# Patient Record
Sex: Male | Born: 1985 | Race: White | Hispanic: No | Marital: Married | Smoking: Current every day smoker
Health system: Southern US, Community
[De-identification: ages and names within clinical notes are randomized; demographics above are authoritative.]

## PROBLEM LIST (undated history)

## (undated) DIAGNOSIS — K219 Gastro-esophageal reflux disease without esophagitis: Secondary | ICD-10-CM

## (undated) DIAGNOSIS — N4400 Torsion of testis, unspecified: Secondary | ICD-10-CM

## (undated) DIAGNOSIS — A4902 Methicillin resistant Staphylococcus aureus infection, unspecified site: Secondary | ICD-10-CM

## (undated) HISTORY — PX: HERNIA REPAIR: SHX51

## (undated) HISTORY — PX: TESTICLE SURGERY: SHX794

---

## 2005-01-05 ENCOUNTER — Emergency Department (HOSPITAL_COMMUNITY): Admission: EM | Admit: 2005-01-05 | Discharge: 2005-01-06 | Payer: Self-pay | Admitting: Emergency Medicine

## 2005-01-06 ENCOUNTER — Emergency Department (HOSPITAL_COMMUNITY): Admission: EM | Admit: 2005-01-06 | Discharge: 2005-01-06 | Payer: Self-pay | Admitting: Emergency Medicine

## 2005-01-07 ENCOUNTER — Emergency Department (HOSPITAL_COMMUNITY): Admission: EM | Admit: 2005-01-07 | Discharge: 2005-01-07 | Payer: Self-pay | Admitting: Emergency Medicine

## 2005-05-16 ENCOUNTER — Emergency Department (HOSPITAL_COMMUNITY): Admission: EM | Admit: 2005-05-16 | Discharge: 2005-05-16 | Payer: Self-pay | Admitting: Emergency Medicine

## 2007-04-09 ENCOUNTER — Emergency Department (HOSPITAL_COMMUNITY): Admission: EM | Admit: 2007-04-09 | Discharge: 2007-04-09 | Payer: Self-pay | Admitting: Emergency Medicine

## 2007-11-17 ENCOUNTER — Emergency Department (HOSPITAL_COMMUNITY): Admission: EM | Admit: 2007-11-17 | Discharge: 2007-11-17 | Payer: Self-pay | Admitting: Emergency Medicine

## 2007-11-24 ENCOUNTER — Emergency Department (HOSPITAL_COMMUNITY): Admission: EM | Admit: 2007-11-24 | Discharge: 2007-11-24 | Payer: Self-pay | Admitting: Emergency Medicine

## 2007-11-29 ENCOUNTER — Emergency Department (HOSPITAL_COMMUNITY): Admission: EM | Admit: 2007-11-29 | Discharge: 2007-11-29 | Payer: Self-pay | Admitting: Emergency Medicine

## 2008-09-10 ENCOUNTER — Emergency Department (HOSPITAL_COMMUNITY): Admission: EM | Admit: 2008-09-10 | Discharge: 2008-09-10 | Payer: Self-pay | Admitting: Emergency Medicine

## 2008-09-10 ENCOUNTER — Ambulatory Visit: Payer: Self-pay | Admitting: Psychiatry

## 2008-09-10 ENCOUNTER — Inpatient Hospital Stay (HOSPITAL_COMMUNITY): Admission: RE | Admit: 2008-09-10 | Discharge: 2008-09-15 | Payer: Self-pay | Admitting: Psychiatry

## 2008-10-06 ENCOUNTER — Emergency Department (HOSPITAL_COMMUNITY): Admission: EM | Admit: 2008-10-06 | Discharge: 2008-10-06 | Payer: Self-pay | Admitting: Emergency Medicine

## 2008-12-13 ENCOUNTER — Ambulatory Visit: Payer: Self-pay | Admitting: Psychiatry

## 2008-12-13 ENCOUNTER — Inpatient Hospital Stay (HOSPITAL_COMMUNITY): Admission: RE | Admit: 2008-12-13 | Discharge: 2008-12-17 | Payer: Self-pay | Admitting: Psychiatry

## 2009-01-14 ENCOUNTER — Ambulatory Visit: Payer: Self-pay | Admitting: Psychiatry

## 2009-01-14 ENCOUNTER — Emergency Department (HOSPITAL_COMMUNITY): Admission: EM | Admit: 2009-01-14 | Discharge: 2009-01-14 | Payer: Self-pay | Admitting: Emergency Medicine

## 2009-01-14 ENCOUNTER — Inpatient Hospital Stay (HOSPITAL_COMMUNITY): Admission: RE | Admit: 2009-01-14 | Discharge: 2009-01-20 | Payer: Self-pay | Admitting: Psychiatry

## 2010-02-01 ENCOUNTER — Emergency Department (HOSPITAL_COMMUNITY)
Admission: EM | Admit: 2010-02-01 | Discharge: 2010-02-01 | Payer: Self-pay | Source: Home / Self Care | Admitting: Emergency Medicine

## 2010-04-13 ENCOUNTER — Emergency Department (HOSPITAL_COMMUNITY)
Admission: EM | Admit: 2010-04-13 | Discharge: 2010-04-13 | Disposition: A | Discharge status: Home / Self Care | Payer: Self-pay | Attending: Emergency Medicine | Admitting: Emergency Medicine

## 2010-04-13 DIAGNOSIS — K089 Disorder of teeth and supporting structures, unspecified: Secondary | ICD-10-CM | POA: Insufficient documentation

## 2010-04-13 DIAGNOSIS — K047 Periapical abscess without sinus: Secondary | ICD-10-CM | POA: Insufficient documentation

## 2010-04-20 LAB — RAPID URINE DRUG SCREEN, HOSP PERFORMED: Cocaine: NOT DETECTED

## 2010-04-20 LAB — LIPID PANEL
Cholesterol: 302 mg/dL — ABNORMAL HIGH (ref 0–200)
Total CHOL/HDL Ratio: 9.7 RATIO
VLDL: UNDETERMINED mg/dL (ref 0–40)

## 2010-04-20 LAB — DIFFERENTIAL
Basophils Relative: 1 % (ref 0–1)
Eosinophils Absolute: 0.2 10*3/uL (ref 0.0–0.7)

## 2010-04-20 LAB — COMPREHENSIVE METABOLIC PANEL
ALT: 32 U/L (ref 0–53)
CO2: 29 mEq/L (ref 19–32)
Potassium: 4 mEq/L (ref 3.5–5.1)
Sodium: 135 mEq/L (ref 135–145)

## 2010-04-20 LAB — URINALYSIS, ROUTINE W REFLEX MICROSCOPIC
Bilirubin Urine: NEGATIVE
Glucose, UA: NEGATIVE mg/dL
Hgb urine dipstick: NEGATIVE
Ketones, ur: NEGATIVE mg/dL
Ketones, ur: NEGATIVE mg/dL
Nitrite: NEGATIVE
Protein, ur: NEGATIVE mg/dL
Protein, ur: NEGATIVE mg/dL
Specific Gravity, Urine: 1.013 (ref 1.005–1.030)
Urobilinogen, UA: 0.2 mg/dL (ref 0.0–1.0)
Urobilinogen, UA: 1 mg/dL (ref 0.0–1.0)
pH: 6 (ref 5.0–8.0)
pH: 7 (ref 5.0–8.0)

## 2010-04-20 LAB — BENZODIAZEPINE, QUANTITATIVE, URINE: Flurazepam GC/MS Conf: NEGATIVE

## 2010-04-20 LAB — URINE CULTURE
Colony Count: NO GROWTH
Colony Count: NO GROWTH
Culture: NO GROWTH
Culture: NO GROWTH
Special Requests: POSITIVE

## 2010-04-20 LAB — DRUGS OF ABUSE SCREEN W/O ALC, ROUTINE URINE
Barbiturate Quant, Ur: NEGATIVE
Creatinine,U: 52.8 mg/dL
Methadone: NEGATIVE
Propoxyphene: NEGATIVE

## 2010-04-20 LAB — URINE MICROSCOPIC-ADD ON

## 2010-04-20 LAB — CBC
HCT: 42.8 % (ref 39.0–52.0)
Hemoglobin: 14.3 g/dL (ref 13.0–17.0)
MCHC: 33.3 g/dL (ref 30.0–36.0)
Platelets: 176 10*3/uL (ref 150–400)
RBC: 4.67 MIL/uL (ref 4.22–5.81)
WBC: 7.1 10*3/uL (ref 4.0–10.5)

## 2010-04-20 LAB — GC/CHLAMYDIA PROBE AMP, URINE: GC Probe Amp, Urine: NEGATIVE

## 2010-04-20 LAB — ETHANOL: Alcohol, Ethyl (B): <5 mg/dL (ref 0–10)

## 2010-04-22 LAB — URINALYSIS, ROUTINE W REFLEX MICROSCOPIC
Glucose, UA: NEGATIVE mg/dL
Protein, ur: NEGATIVE mg/dL
Specific Gravity, Urine: 1.011 (ref 1.005–1.030)
Urobilinogen, UA: 0.2 mg/dL (ref 0.0–1.0)

## 2010-04-22 LAB — COMPREHENSIVE METABOLIC PANEL
AST: 21 U/L (ref 0–37)
BUN: 15 mg/dL (ref 6–23)
Glucose, Bld: 100 mg/dL — ABNORMAL HIGH (ref 70–99)
Potassium: 4.2 mEq/L (ref 3.5–5.1)
Sodium: 141 mEq/L (ref 135–145)

## 2010-04-22 LAB — CBC
HCT: 40.6 % (ref 39.0–52.0)
Hemoglobin: 13.9 g/dL (ref 13.0–17.0)
MCHC: 34.3 g/dL (ref 30.0–36.0)
MCV: 89.8 fL (ref 78.0–100.0)
RBC: 4.52 MIL/uL (ref 4.22–5.81)
RDW: 15.3 % (ref 11.5–15.5)
WBC: 8.4 10*3/uL (ref 4.0–10.5)

## 2010-04-22 LAB — DRUGS OF ABUSE SCREEN W/O ALC, ROUTINE URINE
Amphetamine Screen, Ur: NEGATIVE
Barbiturate Quant, Ur: NEGATIVE
Benzodiazepines.: POSITIVE — AB
Cocaine Metabolites: NEGATIVE
Creatinine,U: 27.3 mg/dL
Opiate Screen, Urine: NEGATIVE

## 2010-04-22 LAB — BENZODIAZEPINE, QUANTITATIVE, URINE
Alprazolam (GC/LC/MS), ur confirm: 260 ng/mL
Oxazepam GC/MS Conf: NEGATIVE

## 2010-04-25 LAB — DIFFERENTIAL
Basophils Relative: 1 % (ref 0–1)
Eosinophils Absolute: 0.1 10*3/uL (ref 0.0–0.7)
Eosinophils Relative: 2 % (ref 0–5)
Monocytes Absolute: 0.5 10*3/uL (ref 0.1–1.0)
Monocytes Relative: 9 % (ref 3–12)

## 2010-04-25 LAB — BASIC METABOLIC PANEL
CO2: 24 mEq/L (ref 19–32)
Chloride: 107 mEq/L (ref 96–112)
GFR calc Af Amer: >60 mL/min (ref >60)
Glucose, Bld: 88 mg/dL (ref 70–99)
Sodium: 140 mEq/L (ref 135–145)

## 2010-04-25 LAB — RAPID URINE DRUG SCREEN, HOSP PERFORMED
Barbiturates: NOT DETECTED
Opiates: POSITIVE — AB

## 2010-04-25 LAB — CBC
HCT: 40 % (ref 39.0–52.0)
Hemoglobin: 13.9 g/dL (ref 13.0–17.0)
MCHC: 34.8 g/dL (ref 30.0–36.0)
MCV: 89.1 fL (ref 78.0–100.0)
RBC: 4.49 MIL/uL (ref 4.22–5.81)

## 2010-05-27 ENCOUNTER — Emergency Department (HOSPITAL_COMMUNITY)
Admission: EM | Admit: 2010-05-27 | Discharge: 2010-05-27 | Disposition: A | Discharge status: Home / Self Care | Payer: Self-pay | Attending: Emergency Medicine | Admitting: Emergency Medicine

## 2010-05-27 ENCOUNTER — Emergency Department (HOSPITAL_COMMUNITY): Payer: Self-pay

## 2010-05-27 DIAGNOSIS — F329 Major depressive disorder, single episode, unspecified: Secondary | ICD-10-CM | POA: Insufficient documentation

## 2010-05-27 DIAGNOSIS — R112 Nausea with vomiting, unspecified: Secondary | ICD-10-CM | POA: Insufficient documentation

## 2010-05-27 DIAGNOSIS — F3289 Other specified depressive episodes: Secondary | ICD-10-CM | POA: Insufficient documentation

## 2010-05-27 DIAGNOSIS — R1032 Left lower quadrant pain: Secondary | ICD-10-CM | POA: Insufficient documentation

## 2010-05-27 DIAGNOSIS — F988 Other specified behavioral and emotional disorders with onset usually occurring in childhood and adolescence: Secondary | ICD-10-CM | POA: Insufficient documentation

## 2010-05-27 LAB — URINALYSIS, ROUTINE W REFLEX MICROSCOPIC
Bilirubin Urine: NEGATIVE
Ketones, ur: NEGATIVE mg/dL
Nitrite: NEGATIVE
pH: 6 (ref 5.0–8.0)

## 2010-05-27 LAB — POCT I-STAT, CHEM 8
Calcium, Ion: 1.06 mmol/L — ABNORMAL LOW (ref 1.12–1.32)
Chloride: 103 mEq/L (ref 96–112)
Glucose, Bld: 90 mg/dL (ref 70–99)
HCT: 43 % (ref 39.0–52.0)
Hemoglobin: 14.6 g/dL (ref 13.0–17.0)
TCO2: 28 mmol/L (ref 0–100)

## 2010-06-02 NOTE — H&P (Signed)
NAMEHERCHEL, Chad Black NO.:  [id number]   MEDICAL RECORD NO.:  [id number]          PATIENT TYPE:  IPS   LOCATION:  0504                          FACILITY:  [facility]   PHYSICIAN:  Geoffery Lyons, M.D.      DATE OF BIRTH:  Dec 23, 1985   DATE OF ADMISSION:  09/10/2008  DATE OF DISCHARGE:                       PSYCHIATRIC ADMISSION ASSESSMENT   This is a 25 year old male involuntarily petitioned on September 10, 2008.   HISTORY OF PRESENT ILLNESS:  The patient is here on petition with  petition papers stating that the patient is suicidal, angry, and  threatening to leave AMA.  The patient states he presented to the  emergency room wanting to get help, feeling depressed and anxious, and  passive suicidal thoughts, having thoughts stating that to end it all.  He states that he, however, does not want to hurt himself.  His  deterrent are his grandmother and his sister.  He denies any  hallucinations.  He has been using substances.   PAST PSYCHIATRIC HISTORY:  First admission to [facility].  He is under a three-way contract at this time.  He has had no prior  inpatient admissions.   SOCIAL HISTORY:  The patient lives in [place].  He lives with  grandmother.  He has no children.  He is a single male.  He is  unemployed.  He states that he will be working in the next few weeks and  has a 9th grade education.   FAMILY HISTORY:  None.   ALCOHOL/DRUG HISTORY:  The patient smokes.  Urine drug screen is  positive for opiates, positive for benzodiazepines, and positive for  THC.   PRIMARY CARE Shanaia Sievers:  None.   MEDICAL PROBLEMS:  Denies any acute or chronic health issues.   MEDICATIONS:  He has been on Darvocet the past for a fractured leg.   DRUG ALLERGIES:  SULFA.   PHYSICAL EXAMINATION:  He was fully assessed at [facility].  He  did threaten to leave.  His urine drug screen is positive for opiates,  positive for benzodiazepines, and positive  for THC.  He received Ativan  during his stay.  CBC is within normal limits.  Alcohol level of 14.  BMET within normal limits.   Physical exam was reviewed with no significant findings.  The patient  does have multiple tattoos and facial piercings   MENTAL STATUS EXAM:  The patient is fully alert and cooperative,  casually dressed.  Again, noted piercings and tattoos.  He has good eye  contact.  He is polite, soft spoken.  The patient's mood is depressed.  When start talking about his symptoms, the patient starts to cry.  He  does appear anxious.  Thought processes are coherent, goal directed.  Denies any suicidal thoughts at this time, wanting help.  His cognitive  function is intact.  Memory is good.  Judgment and insight are fair.  He  appears somewhat limited.   DIAGNOSES:   AXIS I:  1. Polysubstance abuse.  2. Mood disorder.   AXIS II:  Deferred.   AXIS III:  He  has no known medical conditions.   AXIS IV:  Psychosocial problems related to grief.   AXIS V:  Current is 35.   PLAN:  Add Neurontin for anxiety, also Zyprexa for agitation and racing  thoughts.  Will continue to gather more history and assess other  stressors and address his substance use.   His tentative length of stay at this time is 3 to 5 days.      Landry Corporal, N.P.      Geoffery Lyons, M.D.  Electronically Signed    JO/MEDQ  D:  09/11/2008  T:  09/11/2008  Job:  [id number]

## 2010-10-17 ENCOUNTER — Emergency Department (HOSPITAL_COMMUNITY): Payer: Self-pay

## 2010-10-17 ENCOUNTER — Emergency Department (HOSPITAL_COMMUNITY)
Admission: EM | Admit: 2010-10-17 | Discharge: 2010-10-17 | Disposition: A | Discharge status: Home / Self Care | Payer: Self-pay | Attending: Emergency Medicine | Admitting: Emergency Medicine

## 2010-10-17 ENCOUNTER — Encounter: Payer: Self-pay | Admitting: *Deleted

## 2010-10-17 DIAGNOSIS — F17200 Nicotine dependence, unspecified, uncomplicated: Secondary | ICD-10-CM | POA: Insufficient documentation

## 2010-10-17 DIAGNOSIS — X500XXA Overexertion from strenuous movement or load, initial encounter: Secondary | ICD-10-CM | POA: Insufficient documentation

## 2010-10-17 DIAGNOSIS — S93409A Sprain of unspecified ligament of unspecified ankle, initial encounter: Secondary | ICD-10-CM | POA: Insufficient documentation

## 2010-10-17 DIAGNOSIS — S93401A Sprain of unspecified ligament of right ankle, initial encounter: Secondary | ICD-10-CM

## 2010-10-17 DIAGNOSIS — M25579 Pain in unspecified ankle and joints of unspecified foot: Secondary | ICD-10-CM | POA: Insufficient documentation

## 2010-10-17 MED ORDER — OXYCODONE-ACETAMINOPHEN 5-325 MG PO TABS
1.0000 | ORAL_TABLET | Freq: Once | ORAL | Status: AC
  Administered 2010-10-17: 1 via ORAL
  Filled 2010-10-17: qty 1

## 2010-10-17 MED ORDER — OXYCODONE-ACETAMINOPHEN 5-325 MG PO TABS: 1.0000 | ORAL_TABLET | ORAL | Status: AC | PRN

## 2010-10-17 NOTE — ED Notes (Signed)
Pt c/o pain in his right ankle and foot since stepping in a hole this morning. Pt able to wiggle toes. Rt foot warm to touch and pink. Strong pedal pulses palpated. Pt alert and oriented x 3. Skin warm and dry. Pt crying.

## 2010-10-17 NOTE — ED Notes (Signed)
Pt c/o pain to right leg. Pt states he has recently had a cast removed from his foot and this am stepped in a hole in the ground and twisted his ankle.

## 2010-10-17 NOTE — ED Provider Notes (Signed)
History  Scribed for Dr. Bebe Shaggy, the patient was seen in room APFT23. The chart was scribed by Gilman Schmidt. The patients care was started at 1154. CSN: [id number] Arrival date & time: 10/17/2010 11:19 AM  Chief Complaint  Patient presents with  . Ankle Pain    HPI Chad Black is a 25 y.o. male who presents to the Emergency Department complaining of ankle pain. Pt states that he broke his leg about 3 months ago and had his cast removed about 1 month 1/2 ago. Reports stepping into a hole about 45 minutes ago, twisting his leg and hearing a pop. Denies any tenderness in knee or numbness in foot.  PCP: Dr. Christell Constant.  PAST MEDICAL HISTORY:  History reviewed. No pertinent past medical history.   PAST SURGICAL HISTORY:  History reviewed. No pertinent past surgical history.   MEDICATIONS:  Previous Medications   No medications on file     ALLERGIES:  Allergies as of 10/17/2010 - Review Complete 10/17/2010  Allergen Reaction Noted  . Morphine and related Hives 10/17/2010  . Sulfa antibiotics Nausea And Vomiting 10/17/2010     FAMILY HISTORY:  History reviewed. No pertinent family history.   SOCIAL HISTORY: History  Substance Use Topics  . Smoking status: Current Everyday Smoker -- 0.5 packs/day  . Smokeless tobacco: Not on file  . Alcohol Use: No    Review of Systems  Musculoskeletal:       Ankle pain   Neurological: Negative for numbness.  All other systems reviewed and are negative.    Allergies  Morphine and related and Sulfa antibiotics  Home Medications  No current outpatient prescriptions on file.  BP 125/73  Pulse 88  Temp(Src) 97.7 F (36.5 C) (Oral)  Resp 20  SpO2 100%  Physical Exam  CONSTITUTIONAL: Well developed/well nourished HEAD AND FACE: Normocephalic/atraumatic EYES: EOMI/PERRL ENMT: Mucous membranes moist NECK: supple no meningeal signs ABDOMEN: soft NEURO: Pt is awake/alert, moves all extremities x4, no sensory deficit, no distal foot  tenderness EXTREMITIES: pulses normal, full ROM, good pulse Tenderness/swelling to right lateral malleolus, no open skin noted Right distal foot nontender No right prox fib tenderness SKIN: warm, color normal   ED Course  Procedures OTHER DATA REVIEWED: Nursing notes, vital signs, and past medical records reviewed.   DIAGNOSTIC STUDIES: Oxygen Saturation is 100% on room air, normal by my interpretation.   xrays reviewed and considered - no acute fx noted   IMPRESSION: Diagnoses that have been ruled out:  Diagnoses that are still under consideration:  Final diagnoses:  Sprain of right ankle    PLAN:  Home Narcotic pain medication The patient is to return the emergency department if there is any worsening of symptoms.   CONDITION ON DISCHARGE: Good  MEDICATIONS GIVEN IN THE E.D.  Medications  oxyCODONE-acetaminophen (PERCOCET) 5-325 MG per tablet 1 tablet (not administered)  oxyCODONE-acetaminophen (PERCOCET) 5-325 MG per tablet (not administered)    DISCHARGE MEDICATIONS: New Prescriptions   OXYCODONE-ACETAMINOPHEN (PERCOCET) 5-325 MG PER TABLET    Take 1 tablet by mouth every 4 (four) hours as needed for pain.    SCRIBE ATTESTATION: I personally performed the services described in this documentation, which was scribed in my presence. The recorded information has been reviewed and considered.      Joya Gaskins, MD 10/17/10 [phone]

## 2010-10-21 ENCOUNTER — Encounter (HOSPITAL_COMMUNITY): Payer: Self-pay | Admitting: *Deleted

## 2010-10-21 ENCOUNTER — Emergency Department (HOSPITAL_COMMUNITY)
Admission: EM | Admit: 2010-10-21 | Discharge: 2010-10-21 | Disposition: A | Discharge status: Home / Self Care | Payer: Self-pay | Attending: Emergency Medicine | Admitting: Emergency Medicine

## 2010-10-21 DIAGNOSIS — R209 Unspecified disturbances of skin sensation: Secondary | ICD-10-CM | POA: Insufficient documentation

## 2010-10-21 DIAGNOSIS — F17200 Nicotine dependence, unspecified, uncomplicated: Secondary | ICD-10-CM | POA: Insufficient documentation

## 2010-10-21 DIAGNOSIS — R202 Paresthesia of skin: Secondary | ICD-10-CM

## 2010-10-21 MED ORDER — OXYCODONE-ACETAMINOPHEN 5-325 MG PO TABS
1.0000 | ORAL_TABLET | Freq: Once | ORAL | Status: AC
  Administered 2010-10-21: 1 via ORAL
  Filled 2010-10-21: qty 1

## 2010-10-21 MED ORDER — IBUPROFEN 800 MG PO TABS
800.0000 mg | ORAL_TABLET | Freq: Once | ORAL | Status: AC
  Administered 2010-10-21: 800 mg via ORAL
  Filled 2010-10-21: qty 1

## 2010-10-21 NOTE — ED Provider Notes (Signed)
History     CSN: [id number] Arrival date & time: 10/21/2010  9:23 PM  Chief Complaint  Patient presents with  . Ankle Injury    (Consider location/radiation/quality/duration/timing/severity/associated sxs/prior treatment) HPI Comments: Pt states he sustained a fracture of the right ankle about 1 1/2 months ago. He now has tingling of the toes and pain from the ankle to the knee. Pt states he took the cast off himself 2 to 3 weeks ago. He was seen in ED with pain and xray was negative.  Patient is a 25 y.o. male presenting with lower extremity injury. The history is provided by the patient.  Ankle Injury The problem occurs daily. Pertinent negatives include no abdominal pain, arthralgias, chest pain, coughing or neck pain. The symptoms are aggravated by nothing. Treatments tried: narco. The treatment provided no relief.    History reviewed. No pertinent past medical history.  History reviewed. No pertinent past surgical history.  History reviewed. No pertinent family history.  History  Substance Use Topics  . Smoking status: Current Everyday Smoker -- 0.5 packs/day  . Smokeless tobacco: Not on file  . Alcohol Use: No      Review of Systems  Constitutional: Negative for activity change.       All ROS Neg except as noted in HPI  HENT: Negative for nosebleeds and neck pain.   Eyes: Negative for photophobia and discharge.  Respiratory: Negative for cough, shortness of breath and wheezing.   Cardiovascular: Negative for chest pain and palpitations.  Gastrointestinal: Negative for abdominal pain and blood in stool.  Genitourinary: Negative for dysuria, frequency and hematuria.  Musculoskeletal: Negative for back pain and arthralgias.  Skin: Negative.   Neurological: Negative for dizziness, seizures and speech difficulty.  Psychiatric/Behavioral: Negative for hallucinations and confusion.    Allergies  Morphine and related and Sulfa antibiotics  Home Medications   Current  Outpatient Rx  Name Route Sig Dispense Refill  . OXYCODONE-ACETAMINOPHEN 5-325 MG PO TABS Oral Take 1 tablet by mouth every 4 (four) hours as needed for pain. 10 tablet 0    BP 133/69  Pulse 75  Temp(Src) 98.6 F (37 C) (Oral)  Resp 18  Ht 6\' 2"  (1.88 m)  Wt 190 lb (86.183 kg)  BMI 24.39 kg/m2  SpO2 96%  Physical Exam  Nursing note and vitals reviewed. Constitutional: He is oriented to person, place, and time. He appears well-developed and well-nourished.  Non-toxic appearance.  HENT:  Head: Normocephalic.  Right Ear: Tympanic membrane and external ear normal.  Left Ear: Tympanic membrane and external ear normal.  Eyes: EOM and lids are normal. Pupils are equal, round, and reactive to light.  Neck: Normal range of motion. Neck supple. Carotid bruit is not present.  Cardiovascular: Normal rate, regular rhythm, normal heart sounds, intact distal pulses and normal pulses.   Pulmonary/Chest: Breath sounds normal. No respiratory distress.  Abdominal: Soft. Bowel sounds are normal. There is no tenderness. There is no guarding.  Musculoskeletal: Normal range of motion.       Pt has pain with flexing of the toes. His DP an PT pulses are wnl.  Good cap refill No red streaking.  Lymphadenopathy:       Head (right side): No submandibular adenopathy present.       Head (left side): No submandibular adenopathy present.    He has no cervical adenopathy.  Neurological: He is alert and oriented to person, place, and time. He has normal strength. No cranial nerve deficit or sensory deficit.  Skin: Skin is warm and dry.  Psychiatric: He has a normal mood and affect. His speech is normal.    ED Course; Advised pt on need for orthopedic consult due to symptoms.  Ordered crutches and ASO.  Procedures (including critical care time)  Labs Reviewed - No data to display No results found.   Dx: Paresthesia of the right foot.   MDM  I have reviewed nursing notes, vital signs, and all  appropriate lab and imaging results for this patient.        Kathie Dike, [place] 10/21/10 2258

## 2010-10-21 NOTE — ED Notes (Signed)
Patient had taken his cast off 2 weeks off himself, states that his right ankle fx and LE fx, states was seen here 5 days ago

## 2010-10-22 NOTE — ED Provider Notes (Signed)
Medical screening examination/treatment/procedure(s) were performed by non-physician practitioner and as supervising physician I was immediately available for consultation/collaboration.   Benny Lennert, MD 10/22/10 0001

## 2011-02-24 ENCOUNTER — Emergency Department (HOSPITAL_COMMUNITY)
Admission: EM | Admit: 2011-02-24 | Discharge: 2011-02-24 | Disposition: A | Discharge status: Home / Self Care | Payer: Self-pay | Attending: Emergency Medicine | Admitting: Emergency Medicine

## 2011-02-24 ENCOUNTER — Encounter (HOSPITAL_COMMUNITY): Payer: Self-pay | Admitting: *Deleted

## 2011-02-24 DIAGNOSIS — K089 Disorder of teeth and supporting structures, unspecified: Secondary | ICD-10-CM | POA: Insufficient documentation

## 2011-02-24 DIAGNOSIS — R220 Localized swelling, mass and lump, head: Secondary | ICD-10-CM | POA: Insufficient documentation

## 2011-02-24 DIAGNOSIS — R51 Headache: Secondary | ICD-10-CM | POA: Insufficient documentation

## 2011-02-24 DIAGNOSIS — K0889 Other specified disorders of teeth and supporting structures: Secondary | ICD-10-CM

## 2011-02-24 DIAGNOSIS — K029 Dental caries, unspecified: Secondary | ICD-10-CM | POA: Insufficient documentation

## 2011-02-24 DIAGNOSIS — F17200 Nicotine dependence, unspecified, uncomplicated: Secondary | ICD-10-CM | POA: Insufficient documentation

## 2011-02-24 DIAGNOSIS — R6884 Jaw pain: Secondary | ICD-10-CM | POA: Insufficient documentation

## 2011-02-24 MED ORDER — PENICILLIN V POTASSIUM 250 MG PO TABS
500.0000 mg | ORAL_TABLET | Freq: Once | ORAL | Status: AC
  Administered 2011-02-24: 500 mg via ORAL
  Filled 2011-02-24: qty 2

## 2011-02-24 MED ORDER — ONDANSETRON HCL 4 MG PO TABS
4.0000 mg | ORAL_TABLET | Freq: Once | ORAL | Status: AC
  Administered 2011-02-24: 4 mg via ORAL
  Filled 2011-02-24: qty 1

## 2011-02-24 MED ORDER — TRAMADOL HCL 50 MG PO TABS: 50.0000 mg | ORAL_TABLET | Freq: Four times a day (QID) | ORAL | Status: AC | PRN

## 2011-02-24 MED ORDER — IBUPROFEN 800 MG PO TABS
800.0000 mg | ORAL_TABLET | Freq: Once | ORAL | Status: AC
  Administered 2011-02-24: 800 mg via ORAL
  Filled 2011-02-24: qty 1

## 2011-02-24 MED ORDER — IBUPROFEN 800 MG PO TABS: 800.0000 mg | ORAL_TABLET | Freq: Three times a day (TID) | ORAL | Status: AC

## 2011-02-24 MED ORDER — TRAMADOL HCL 50 MG PO TABS
100.0000 mg | ORAL_TABLET | Freq: Once | ORAL | Status: DC
  Filled 2011-02-24: qty 2

## 2011-02-24 MED ORDER — ACETAMINOPHEN-CODEINE #3 300-30 MG PO TABS: 1.0000 | ORAL_TABLET | Freq: Four times a day (QID) | ORAL | Status: AC | PRN

## 2011-02-24 MED ORDER — AMOXICILLIN 500 MG PO CAPS: ORAL_CAPSULE | ORAL | Status: DC
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
## 2011-02-24 NOTE — ED Notes (Signed)
Pt denies ability to take tramadol for pain stating it makes him nauseated and vomit. Pt demands "want something else for pain, something stronger, just cuz I don't have insurance doesn't mean I can't have the good stuff". Pt's views and concerns taken to EDP.

## 2011-02-24 NOTE — ED Provider Notes (Signed)
.  Medical screening examination/treatment/procedure(s) were performed by non-physician practitioner and as supervising physician I was immediately available for consultation/collaboration.   Laray Anger, DO 02/24/11 1801

## 2011-02-24 NOTE — ED Provider Notes (Signed)
History     CSN: [id number]  Arrival date & time 02/24/11  1236   First MD Initiated Contact with Patient 02/24/11 1401      Chief Complaint  Patient presents with  . Dental Pain    (Consider location/radiation/quality/duration/timing/severity/associated sxs/prior treatment) Patient is a 26 y.o. male presenting with tooth pain. The history is provided by the patient.  Dental PainThe primary symptoms include mouth pain and headaches. Primary symptoms do not include shortness of breath or cough. The symptoms began more than 1 week ago. The symptoms are worsening. The symptoms occur constantly.  The headache is not associated with photophobia.  Additional symptoms include: dental sensitivity to temperature, gum swelling, gum tenderness and jaw pain. Additional symptoms do not include: nosebleeds. Medical issues include: smoking.    History reviewed. No pertinent past medical history.  Past Surgical History  Procedure Date  . Testicle surgery     History reviewed. No pertinent family history.  History  Substance Use Topics  . Smoking status: Current Everyday Smoker -- 0.5 packs/day  . Smokeless tobacco: Not on file  . Alcohol Use: No      Review of Systems  Constitutional: Negative for activity change.       All ROS Neg except as noted in HPI  HENT: Negative for nosebleeds and neck pain.   Eyes: Negative for photophobia and discharge.  Respiratory: Negative for cough, shortness of breath and wheezing.   Cardiovascular: Negative for chest pain and palpitations.  Gastrointestinal: Negative for abdominal pain and blood in stool.  Genitourinary: Negative for dysuria, frequency and hematuria.  Musculoskeletal: Negative for back pain and arthralgias.  Skin: Negative.   Neurological: Positive for headaches. Negative for dizziness, seizures and speech difficulty.  Psychiatric/Behavioral: Negative for hallucinations and confusion.    Allergies  Methadone and Sulfa  antibiotics  Home Medications   Current Outpatient Rx  Name Route Sig Dispense Refill  . IBUPROFEN 600 MG PO TABS Oral Take 600 mg by mouth every 6 (six) hours as needed. Tooth pain, patient states he took 2 tabs about 11:00 am this morning    . AMOXICILLIN 500 MG PO CAPS  2 tabs by mouth twice a day with food 28 capsule 0  . IBUPROFEN 800 MG PO TABS Oral Take 1 tablet (800 mg total) by mouth 3 (three) times daily. 21 tablet 0  . TRAMADOL HCL 50 MG PO TABS Oral Take 1 tablet (50 mg total) by mouth every 6 (six) hours as needed for pain. 20 tablet 0    BP 100/60  Pulse 82  Temp(Src) 98.9 F (37.2 C) (Oral)  Resp 20  Ht 6\' 1"  (1.854 m)  Wt 171 lb (77.565 kg)  BMI 22.56 kg/m2  SpO2 100%  Physical Exam  Nursing note and vitals reviewed. Constitutional: He is oriented to person, place, and time. He appears well-developed and well-nourished.  Non-toxic appearance.  HENT:  Head: Normocephalic.  Right Ear: Tympanic membrane and external ear normal.  Left Ear: Tympanic membrane and external ear normal.       Multiple dental caries upper and lower gum area. There is swelling of the left upper over the canine and premolar areas.  Eyes: EOM and lids are normal. Pupils are equal, round, and reactive to light.  Neck: Normal range of motion. Neck supple. Carotid bruit is not present.  Cardiovascular: Normal rate, regular rhythm, normal heart sounds, intact distal pulses and normal pulses.   Pulmonary/Chest: Breath sounds normal. No respiratory distress.  Abdominal:  Soft. Bowel sounds are normal. There is no tenderness. There is no guarding.  Musculoskeletal: Normal range of motion.  Lymphadenopathy:       Head (right side): No submandibular adenopathy present.       Head (left side): No submandibular adenopathy present.    He has no cervical adenopathy.  Neurological: He is alert and oriented to person, place, and time. He has normal strength. No cranial nerve deficit or sensory deficit.   Skin: Skin is warm and dry.  Psychiatric: He has a normal mood and affect. His speech is normal.    ED Course  Procedures (including critical care time)  Labs Reviewed - No data to display No results found.   1. Toothache       MDM  I have reviewed nursing notes, vital signs, and all appropriate lab and imaging results for this patient.        Kathie Dike, [place] 02/24/11 1536

## 2011-02-24 NOTE — ED Notes (Signed)
Pt out to desk insisting on narcotics. Stating he needs them NOW. Pt asked to wait in his room.

## 2011-04-15 ENCOUNTER — Other Ambulatory Visit: Payer: Self-pay

## 2011-04-15 ENCOUNTER — Emergency Department (HOSPITAL_COMMUNITY): Payer: Self-pay

## 2011-04-15 ENCOUNTER — Encounter (HOSPITAL_COMMUNITY): Payer: Self-pay | Admitting: *Deleted

## 2011-04-15 ENCOUNTER — Emergency Department (HOSPITAL_COMMUNITY)
Admission: EM | Admit: 2011-04-15 | Discharge: 2011-04-15 | Disposition: A | Discharge status: Home / Self Care | Payer: Self-pay | Attending: Emergency Medicine | Admitting: Emergency Medicine

## 2011-04-15 DIAGNOSIS — R404 Transient alteration of awareness: Secondary | ICD-10-CM | POA: Insufficient documentation

## 2011-04-15 DIAGNOSIS — Y92009 Unspecified place in unspecified non-institutional (private) residence as the place of occurrence of the external cause: Secondary | ICD-10-CM | POA: Insufficient documentation

## 2011-04-15 DIAGNOSIS — S0990XA Unspecified injury of head, initial encounter: Secondary | ICD-10-CM | POA: Insufficient documentation

## 2011-04-15 DIAGNOSIS — F319 Bipolar disorder, unspecified: Secondary | ICD-10-CM | POA: Insufficient documentation

## 2011-04-15 DIAGNOSIS — F17200 Nicotine dependence, unspecified, uncomplicated: Secondary | ICD-10-CM | POA: Insufficient documentation

## 2011-04-15 DIAGNOSIS — R071 Chest pain on breathing: Secondary | ICD-10-CM | POA: Insufficient documentation

## 2011-04-15 DIAGNOSIS — N4400 Torsion of testis, unspecified: Secondary | ICD-10-CM | POA: Insufficient documentation

## 2011-04-15 DIAGNOSIS — W08XXXA Fall from other furniture, initial encounter: Secondary | ICD-10-CM | POA: Insufficient documentation

## 2011-04-15 DIAGNOSIS — W19XXXA Unspecified fall, initial encounter: Secondary | ICD-10-CM

## 2011-04-15 HISTORY — DX: Torsion of testis, unspecified: N44.00

## 2011-04-15 MED ORDER — ONDANSETRON 8 MG PO TBDP
ORAL_TABLET | ORAL | Status: AC
  Filled 2011-04-15: qty 1

## 2011-04-15 MED ORDER — PROMETHAZINE HCL 12.5 MG PO TABS
ORAL_TABLET | ORAL | Status: AC
  Filled 2011-04-15: qty 2

## 2011-04-15 MED ORDER — ONDANSETRON 8 MG PO TBDP: 8.0000 mg | ORAL_TABLET | Freq: Once | ORAL | Status: DC

## 2011-04-15 MED ORDER — OXYCODONE-ACETAMINOPHEN 5-325 MG PO TABS: 2.0000 | ORAL_TABLET | ORAL | Status: AC | PRN

## 2011-04-15 MED ORDER — HYDROCODONE-ACETAMINOPHEN 5-325 MG PO TABS
2.0000 | ORAL_TABLET | Freq: Once | ORAL | Status: AC
  Administered 2011-04-15: 2 via ORAL
  Filled 2011-04-15: qty 2

## 2011-04-15 MED ORDER — PROMETHAZINE HCL 12.5 MG PO TABS: 25.0000 mg | ORAL_TABLET | Freq: Once | ORAL | Status: DC

## 2011-04-15 MED ORDER — OXYCODONE-ACETAMINOPHEN 5-325 MG PO TABS
1.0000 | ORAL_TABLET | Freq: Once | ORAL | Status: AC
  Administered 2011-04-15: 1 via ORAL

## 2011-04-15 MED ORDER — OXYCODONE-ACETAMINOPHEN 5-325 MG PO TABS
ORAL_TABLET | ORAL | Status: AC
  Administered 2011-04-15: 1 via ORAL
  Filled 2011-04-15: qty 1
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
## 2011-04-15 NOTE — ED Provider Notes (Addendum)
History   Scribed for Doug Sou, MD, the patient was seen in room APA09/APA09 . This chart was scribed by Lewanda Rife.   CSN: [id number]  Arrival date & time 04/15/11  2007   First MD Initiated Contact with Patient 04/15/11 2019      Chief Complaint  Patient presents with  . Fall  . Chest Pain    (Consider location/radiation/quality/duration/timing/severity/associated sxs/prior treatment) HPI Chad Black is a 26 y.o. male who presents to the Emergency Department complaining of severe chest wall pain since 30 min prior to arrival. Pt is anxious and tearful on encounter. Pt states he fell off a stool while trying to retrieve an object from the top shelf. Pt reports associated LOC when he hit his head and was "knocked out" for an unknown period of time. Pt states he woke up following fall with chest pain and called EMS. Pt describes the chest pain as waxing and waning, non-pleuritic, and lasting 10 minutes with each episode. Pt states palpation and abduction of right shoulder makes the chest pain worse.     Past Medical History  Diagnosis Date  . Manic depression   . Testicular torsion     Past Surgical History  Procedure Date  . Testicle surgery     History reviewed. No pertinent family history.  History  Substance Use Topics  . Smoking status: Current Everyday Smoker -- 0.5 packs/day  . Smokeless tobacco: Not on file  . Alcohol Use: No      Review of Systems  Constitutional: Negative.   HENT: Negative.   Respiratory: Negative.   Cardiovascular: Positive for chest pain (chest wall pain).  Gastrointestinal: Negative.   Musculoskeletal: Negative.   Skin: Negative.   Neurological: Negative.   Hematological: Negative.   Psychiatric/Behavioral: Negative.     Allergies  Morphine and related and Sulfa antibiotics  Home Medications   Current Outpatient Rx  Name Route Sig Dispense Refill  . ESCITALOPRAM OXALATE 10 MG PO TABS Oral Take by mouth  daily. Pt does not know doseage    . AMOXICILLIN 500 MG PO CAPS  2 tabs by mouth twice a day with food 28 capsule 0  . IBUPROFEN 600 MG PO TABS Oral Take 600 mg by mouth every 6 (six) hours as needed. Tooth pain, patient states he took 2 tabs about 11:00 am this morning      BP 110/63  Pulse 85  Temp(Src) 98.1 F (36.7 C) (Oral)  Resp 20  Ht 6\' 1"  (1.854 m)  Wt 180 lb (81.647 kg)  BMI 23.75 kg/m2  SpO2 99%  Physical Exam  Nursing note and vitals reviewed. Constitutional: He appears well-developed and well-nourished.       Pt teary eyed and anxious on exam  HENT:  Head: Normocephalic and atraumatic.  Eyes: Conjunctivae are normal. Pupils are equal, round, and reactive to light.  Neck: Neck supple. No tracheal deviation present. No thyromegaly present.  Cardiovascular: Normal rate and regular rhythm.   No murmur heard. Pulmonary/Chest: Effort normal and breath sounds normal. He exhibits tenderness (chest wall tenderness on exam ).  Abdominal: Soft. Bowel sounds are normal. He exhibits no distension. There is no tenderness.  Musculoskeletal: Normal range of motion. He exhibits no edema and no tenderness.       When he actively abducts his right shoulder pain in chest wall is reproduced    Neurological: He is alert. Coordination normal.  Skin: Skin is warm and dry. No rash noted.  Psychiatric: He  has a normal mood and affect.    ED Course  Procedures (including critical care time)  Labs Reviewed - No data to display No results found.   No diagnosis found.   Date: 04/15/2011  Rate: 65  Rhythm: normal sinus rhythm  QRS Axis: normal  Intervals: normal  ST/T Wave abnormalities: normal and ST elevations diffusely  Conduction Disutrbances:none  Narrative Interpretation:   Old EKG Reviewed: none available Results for orders placed during the hospital encounter of 05/27/10  POCT I-STAT, CHEM 8      Component Value Range   Sodium 137  135 - 145 (mEq/L)   Potassium 4.3  3.5  - 5.1 (mEq/L)   Chloride 103  96 - 112 (mEq/L)   BUN 16  6 - 23 (mg/dL)   Creatinine, Ser 0.98  0.4 - 1.5 (mg/dL)   Glucose, Bld 90  70 - 99 (mg/dL)   Calcium, Ion 1.19 (*) 1.12 - 1.32 (mmol/L)   TCO2 28  0 - 100 (mmol/L)   Hemoglobin 14.6  13.0 - 17.0 (g/dL)   HCT 14.7  82.9 - 56.2 (%)  URINALYSIS, ROUTINE W REFLEX MICROSCOPIC      Component Value Range   Color, Urine YELLOW  YELLOW    APPearance CLEAR  CLEAR    Specific Gravity, Urine 1.020  1.005 - 1.030    pH 6.0  5.0 - 8.0    Glucose, UA NEGATIVE  NEGATIVE (mg/dL)   Hgb urine dipstick NEGATIVE  NEGATIVE    Bilirubin Urine NEGATIVE  NEGATIVE    Ketones, ur NEGATIVE  NEGATIVE (mg/dL)   Protein, ur NEGATIVE  NEGATIVE (mg/dL)   Urobilinogen, UA 0.2  0.0 - 1.0 (mg/dL)   Nitrite NEGATIVE  NEGATIVE    Leukocytes, UA    NEGATIVE    Value: NEGATIVE MICROSCOPIC NOT DONE ON URINES WITH NEGATIVE PROTEIN, BLOOD, LEUKOCYTES, NITRITE, OR GLUCOSE <1000 mg/dL.   Dg Chest 1 View  04/15/2011  *RADIOLOGY REPORT*  Clinical Data: Status post fall; concern for chest injury.  CHEST - 1 VIEW  Comparison: None.  Findings: The lungs are relatively well-aerated and clear.  There is no evidence of focal opacification, pleural effusion or pneumothorax.  The cardiomediastinal silhouette is within normal limits.  No acute osseous abnormalities are seen.  IMPRESSION: No acute cardiopulmonary process seen; no displaced rib fractures identified.  Original Report Authenticated By: Tonia Ghent, M.D.   Ct Head Wo Contrast  04/15/2011  *RADIOLOGY REPORT*  Clinical Data: Status post fall from standing position off stool; loss of consciousness.  Concern for head injury.  CT HEAD WITHOUT CONTRAST  Technique:  Contiguous axial images were obtained from the base of the skull through the vertex without contrast.  Comparison: None.  Findings: There is no evidence of acute infarction, mass lesion, or intra- or extra-axial hemorrhage on CT.  The posterior fossa, including the  cerebellum, brainstem and fourth ventricle, is within normal limits.  The third and lateral ventricles, and basal ganglia are unremarkable in appearance.  The cerebral hemispheres are symmetric in appearance, with normal gray- white differentiation.  No mass effect or midline shift is seen.  There is no evidence of fracture; visualized osseous structures are unremarkable in appearance.  The visualized portions of the orbits are within normal limits.  There is mild partial opacification of the right maxillary sinus; the remaining paranasal sinuses and mastoid air cells are well-aerated.  No significant soft tissue abnormalities are seen.  IMPRESSION:  1.  No evidence of traumatic  intracranial injury or fracture. 2.  Mild partial opacification of the right maxillary sinus.  Original Report Authenticated By: Tonia Ghent, M.D.    10:30 PM she is alert ambulatory coma score 15. Requesting Percocet by name. Percocet one tablet by mouth ordered by me MDM  Assessment: Patient's physical exam is not at all consistent with acute pericarditis his chest wall is exquisitely tender after her fall ST changes on EKG likely secondary to minor early repolarization rather than pericarditis or injury Note patient reported to nurse that I requested an Ace wrap, which I did not Plan prescription Percocet dispense 4  Diagnosis #1 fall #2 minor closed head trauma by history #3 chest wall pain     I personally performed the services described in this documentation, which was scribed in my presence. The recorded information has been reviewed and considered.  Doug Sou, MD 04/15/11 1610  Doug Sou, MD 04/15/11 [id number]

## 2011-04-15 NOTE — Discharge Instructions (Signed)
Take Tylenol for mild pain or the pain medicine prescribed for bad. Your x-rays and CT scan tonight were normal

## 2011-04-16 ENCOUNTER — Emergency Department (HOSPITAL_COMMUNITY)
Admission: EM | Admit: 2011-04-16 | Discharge: 2011-04-16 | Disposition: A | Discharge status: Home / Self Care | Payer: Self-pay | Attending: Emergency Medicine | Admitting: Emergency Medicine

## 2011-04-16 ENCOUNTER — Emergency Department (HOSPITAL_COMMUNITY): Payer: Self-pay

## 2011-04-16 ENCOUNTER — Encounter (HOSPITAL_COMMUNITY): Payer: Self-pay | Admitting: Emergency Medicine

## 2011-04-16 DIAGNOSIS — S93401A Sprain of unspecified ligament of right ankle, initial encounter: Secondary | ICD-10-CM

## 2011-04-16 DIAGNOSIS — X500XXA Overexertion from strenuous movement or load, initial encounter: Secondary | ICD-10-CM | POA: Insufficient documentation

## 2011-04-16 DIAGNOSIS — F17200 Nicotine dependence, unspecified, uncomplicated: Secondary | ICD-10-CM | POA: Insufficient documentation

## 2011-04-16 DIAGNOSIS — S93409A Sprain of unspecified ligament of unspecified ankle, initial encounter: Secondary | ICD-10-CM | POA: Insufficient documentation

## 2011-04-16 MED ORDER — HYDROCODONE-ACETAMINOPHEN 5-325 MG PO TABS: 1.0000 | ORAL_TABLET | ORAL | Status: AC | PRN

## 2011-04-16 MED ORDER — HYDROCODONE-ACETAMINOPHEN 5-325 MG PO TABS
1.0000 | ORAL_TABLET | Freq: Once | ORAL | Status: AC
  Administered 2011-04-16: 1 via ORAL
  Filled 2011-04-16: qty 1

## 2011-04-16 MED ORDER — IBUPROFEN 800 MG PO TABS: 800.0000 mg | ORAL_TABLET | Freq: Three times a day (TID) | ORAL | Status: AC

## 2011-04-16 NOTE — ED Provider Notes (Signed)
History     CSN: [id number]  Arrival date & time 04/16/11  1520   First MD Initiated Contact with Patient 04/16/11 1537      Chief Complaint  Patient presents with  . Ankle Injury    (Consider location/radiation/quality/duration/timing/severity/associated sxs/prior treatment) HPI History provided by pt.   Pt has h/o right ankle fracture and was told to f/u with his orthopedist if he has a sprain in future.  Was walking down the stairs today and right foot inverted.  Has had severe pain of right lateral foot and ankle ever since and is unable to bear weight.  Has not taken anything for pain.  Associated w/ paresthesias of toes.    Past Medical History  Diagnosis Date  . Manic depression   . Testicular torsion     Past Surgical History  Procedure Date  . Testicle surgery     Family History  Problem Relation Age of Onset  . Heart attack Father 59    Died    History  Substance Use Topics  . Smoking status: Current Everyday Smoker -- 0.5 packs/day    Types: Cigarettes  . Smokeless tobacco: Current User  . Alcohol Use: No      Review of Systems  All other systems reviewed and are negative.    Allergies  Morphine and related and Sulfa antibiotics  Home Medications   Current Outpatient Rx  Name Route Sig Dispense Refill  . ACETAMINOPHEN 500 MG PO TABS Oral Take 1,000 mg by mouth daily as needed. For pain    . ESCITALOPRAM OXALATE 10 MG PO TABS Oral Take by mouth daily. Pt does not know doseage    . OXYCODONE-ACETAMINOPHEN 5-325 MG PO TABS Oral Take 2 tablets by mouth every 4 (four) hours as needed for pain. 4 tablet 0  . REWETTING DROPS SOLN Does not apply 1 drop by Does not apply route daily.      BP 111/64  Pulse 64  Temp 98.1 F (36.7 C)  Resp 16  SpO2 100%  Physical Exam  Nursing note and vitals reviewed. Constitutional: He is oriented to person, place, and time. He appears well-developed and well-nourished. No distress.  HENT:  Head:  Normocephalic and atraumatic.  Eyes:       Normal appearance  Neck: Normal range of motion.  Musculoskeletal:       No deformity or ecchymosis of right ankle/foot.  Edema of right lateral malleolus.  Tenderness of lateral malleolus and 3rd-5th metatarsals.  Pain w/ minimal ankle ROM.  Active ROM of toes.  No sensation to light/sharp touch in 4th and 5th toes.  Toes warm and brisk cap refill.    Neurological: He is alert and oriented to person, place, and time.  Psychiatric: He has a normal mood and affect. His behavior is normal.    ED Course  Procedures (including critical care time)  Labs Reviewed - No data to display Dg Chest 1 View  04/15/2011  *RADIOLOGY REPORT*  Clinical Data: Status post fall; concern for chest injury.  CHEST - 1 VIEW  Comparison: None.  Findings: The lungs are relatively well-aerated and clear.  There is no evidence of focal opacification, pleural effusion or pneumothorax.  The cardiomediastinal silhouette is within normal limits.  No acute osseous abnormalities are seen.  IMPRESSION: No acute cardiopulmonary process seen; no displaced rib fractures identified.  Original Report Authenticated By: Tonia Ghent, M.D.   Dg Ankle Complete Right  04/16/2011  *RADIOLOGY REPORT*  Clinical Data: History  of ankle fracture 1 year ago.  Status post fall today.  Pain and swelling.  RIGHT ANKLE - COMPLETE 3+ VIEW  Comparison: Plain films 10/17/2010.  Findings: Tiny well corticated bony fragment off the lateral malleolus is again seen.  No acute fracture is identified.  No dislocation.  There is some lateral soft tissue swelling.  IMPRESSION: No acute bony or joint abnormality.  Lateral soft tissue swelling.  Original Report Authenticated By: Bernadene Bell. Maricela Curet, M.D.   Ct Head Wo Contrast  04/15/2011  *RADIOLOGY REPORT*  Clinical Data: Status post fall from standing position off stool; loss of consciousness.  Concern for head injury.  CT HEAD WITHOUT CONTRAST  Technique:  Contiguous  axial images were obtained from the base of the skull through the vertex without contrast.  Comparison: None.  Findings: There is no evidence of acute infarction, mass lesion, or intra- or extra-axial hemorrhage on CT.  The posterior fossa, including the cerebellum, brainstem and fourth ventricle, is within normal limits.  The third and lateral ventricles, and basal ganglia are unremarkable in appearance.  The cerebral hemispheres are symmetric in appearance, with normal gray- white differentiation.  No mass effect or midline shift is seen.  There is no evidence of fracture; visualized osseous structures are unremarkable in appearance.  The visualized portions of the orbits are within normal limits.  There is mild partial opacification of the right maxillary sinus; the remaining paranasal sinuses and mastoid air cells are well-aerated.  No significant soft tissue abnormalities are seen.  IMPRESSION:  1.  No evidence of traumatic intracranial injury or fracture. 2.  Mild partial opacification of the right maxillary sinus.  Original Report Authenticated By: Tonia Ghent, M.D.     1. Sprain of right ankle       MDM  26yo M w/ h/o right ankle fx presents w/ right ankle inversion injury.  Exam sig for edema/ttp of right lateral malleolus, tenderness of 3rd-5th metatarsals and sensory deficits in 4th and 5th toes.  Xrays ankle/foot neg for fx/disclocation.  Results discussed w/ pt.  He received one po vicodin for pain.  Ortho tech placed in ASO and provided him w/ crutches.  D/c'd home w/ vicodin, ibuprofen and referral to ortho.        Chad Black, Chad Black 04/16/11 1731

## 2011-04-16 NOTE — Progress Notes (Signed)
Confirmed pt self pay without pcp from [employer] Cm provided the [facility] contact information. Male family member states she tried the clinic but was told pt had to have at least a 30 hr week job.  Cm attempted contact the clinic but the office is closed for Good Friday

## 2011-04-16 NOTE — Discharge Instructions (Signed)
Take ibuprofen, up to 800mg  three times a day, as needed for pain.  Take vicodin as prescribed for severe pain.   Ice 3 times a day for 15-20 minutes.  Elevate when possible.  Activity as tolerated.  Follow up with your orthopedic physician or the one you have been referred to today.  You may return to the ER if your pain worsens or you have any other concerns.  Ankle Sprain An ankle sprain is an injury to the strong, fibrous tissues (ligaments) that hold the bones of your ankle joint together.  CAUSES Ankle sprain usually is caused by a fall or by twisting your ankle. People who participate in sports are more prone to these types of injuries.  SYMPTOMS  Symptoms of ankle sprain include:  Pain in your ankle. The pain may be present at rest or only when you are trying to stand or walk.   Swelling.   Bruising. Bruising may develop immediately or within 1 to 2 days after your injury.   Difficulty standing or walking.  DIAGNOSIS  Your caregiver will ask you details about your injury and perform a physical exam of your ankle to determine if you have an ankle sprain. During the physical exam, your caregiver will press and squeeze specific areas of your foot and ankle. Your caregiver will try to move your ankle in certain ways. An X-ray exam may be done to be sure a bone was not broken or a ligament did not separate from one of the bones in your ankle (avulsion).  TREATMENT  Certain types of braces can help stabilize your ankle. Your caregiver can make a recommendation for this. Your caregiver may recommend the use of medication for pain. If your sprain is severe, your caregiver may refer you to a surgeon who helps to restore function to parts of your skeletal system (orthopedist) or a physical therapist. HOME CARE INSTRUCTIONS  Apply ice to your injury for 1 to 2 days or as directed by your caregiver. Applying ice helps to reduce inflammation and pain.  Put ice in a plastic bag.   Place a towel  between your skin and the bag.   Leave the ice on for 15 to 20 minutes at a time, every 2 hours while you are awake.   Take over-the-counter or prescription medicines for pain, discomfort, or fever only as directed by your caregiver.   Keep your injured leg elevated, when possible, to lessen swelling.   If your caregiver recommends crutches, use them as instructed. Gradually, put weight on the affected ankle. Continue to use crutches or a cane until you can walk without feeling pain in your ankle.   If you have a plaster splint, wear the splint as directed by your caregiver. Do not rest it on anything harder than a pillow the first 24 hours. Do not put weight on it. Do not get it wet. You may take it off to take a shower or bath.   You may have been given an elastic bandage to wear around your ankle to provide support. If the elastic bandage is too tight (you have numbness or tingling in your foot or your foot becomes cold and blue), adjust the bandage to make it comfortable.   If you have an air splint, you may blow more air into it or let air out to make it more comfortable. You may take your splint off at night and before taking a shower or bath.   Wiggle your toes in  the splint several times per day if you are able.  SEEK MEDICAL CARE IF:   You have an increase in bruising, swelling, or pain.   Your toes feel cold.   Pain relief is not achieved with medication.  SEEK IMMEDIATE MEDICAL CARE IF: Your toes are numb or blue or you have severe pain. MAKE SURE YOU:   Understand these instructions.   Will watch your condition.   Will get help right away if you are not doing well or get worse.  Document Released: 01/04/2005 Document Revised: 12/24/2010 Document Reviewed: 08/09/2007 [facility] Patient Information 2012 [place], [place].

## 2011-04-16 NOTE — ED Notes (Signed)
Patient transported to X-ray 

## 2011-04-16 NOTE — ED Notes (Signed)
Pt states he broke his right ankle about a year ago. Today, he was walking down steps and his ankle turned.  Pt unable to move ankle at this time, moderate swelling noted, pedal pulse present, able to move toes.

## 2011-04-16 NOTE — ED Provider Notes (Signed)
Medical screening examination/treatment/procedure(s) were performed by non-physician practitioner and as supervising physician I was immediately available for consultation/collaboration.  Cheri Guppy, MD 04/16/11 [phone]

## 2011-04-20 ENCOUNTER — Encounter (HOSPITAL_COMMUNITY): Payer: Self-pay | Admitting: Emergency Medicine

## 2011-04-20 ENCOUNTER — Emergency Department (HOSPITAL_COMMUNITY): Payer: Self-pay

## 2011-04-20 ENCOUNTER — Emergency Department (HOSPITAL_COMMUNITY)
Admission: EM | Admit: 2011-04-20 | Discharge: 2011-04-20 | Disposition: A | Discharge status: Home / Self Care | Payer: Self-pay | Attending: Emergency Medicine | Admitting: Emergency Medicine

## 2011-04-20 DIAGNOSIS — M25579 Pain in unspecified ankle and joints of unspecified foot: Secondary | ICD-10-CM | POA: Insufficient documentation

## 2011-04-20 DIAGNOSIS — Y990 Civilian activity done for income or pay: Secondary | ICD-10-CM | POA: Insufficient documentation

## 2011-04-20 DIAGNOSIS — S9000XA Contusion of unspecified ankle, initial encounter: Secondary | ICD-10-CM | POA: Insufficient documentation

## 2011-04-20 DIAGNOSIS — Y9269 Other specified industrial and construction area as the place of occurrence of the external cause: Secondary | ICD-10-CM | POA: Insufficient documentation

## 2011-04-20 DIAGNOSIS — W1789XA Other fall from one level to another, initial encounter: Secondary | ICD-10-CM | POA: Insufficient documentation

## 2011-04-20 HISTORY — DX: Methicillin resistant Staphylococcus aureus infection, unspecified site: A49.02

## 2011-04-20 MED ORDER — IBUPROFEN 800 MG PO TABS
800.0000 mg | ORAL_TABLET | Freq: Once | ORAL | Status: AC
  Administered 2011-04-20: 800 mg via ORAL
  Filled 2011-04-20: qty 1

## 2011-04-20 MED ORDER — NAPROXEN 500 MG PO TABS: 500.0000 mg | ORAL_TABLET | Freq: Two times a day (BID) | ORAL | Status: DC

## 2011-04-20 NOTE — Discharge Instructions (Signed)
Cryotherapy Cryotherapy means treatment with cold. Ice or gel packs can be used to reduce both pain and swelling. Ice is the most helpful within the first 24 to 48 hours after an injury or flareup from overusing a muscle or joint. Sprains, strains, spasms, burning pain, shooting pain, and aches can all be eased with ice. Ice can also be used when recovering from surgery. Ice is effective, has very few side effects, and is safe for most people to use. PRECAUTIONS  Ice is not a safe treatment option for people with:  Raynaud's phenomenon. This is a condition affecting small blood vessels in the extremities. Exposure to cold may cause your problems to return.   Cold hypersensitivity. There are many forms of cold hypersensitivity, including:   Cold urticaria. Red, itchy hives appear on the skin when the tissues begin to warm after being iced.   Cold erythema. This is a red, itchy rash caused by exposure to cold.   Cold hemoglobinuria. Red blood cells break down when the tissues begin to warm after being iced. The hemoglobin that carry oxygen are passed into the urine because they cannot combine with blood proteins fast enough.   Numbness or altered sensitivity in the area being iced.  If you have any of the following conditions, do not use ice until you have discussed cryotherapy with your caregiver:  Heart conditions, such as arrhythmia, angina, or chronic heart disease.   High blood pressure.   Healing wounds or open skin in the area being iced.   Current infections.   Rheumatoid arthritis.   Poor circulation.   Diabetes.  Ice slows the blood flow in the region it is applied. This is beneficial when trying to stop inflamed tissues from spreading irritating chemicals to surrounding tissues. However, if you expose your skin to cold temperatures for too long or without the proper protection, you can damage your skin or nerves. Watch for signs of skin damage due to cold. HOME CARE  INSTRUCTIONS Follow these tips to use ice and cold packs safely.  Place a dry or damp towel between the ice and skin. A damp towel will cool the skin more quickly, so you may need to shorten the time that the ice is used.   For a more rapid response, add gentle compression to the ice.   Ice for no more than 10 to 20 minutes at a time. The bonier the area you are icing, the less time it will take to get the benefits of ice.   Check your skin after 5 minutes to make sure there are no signs of a poor response to cold or skin damage.   Rest 20 minutes or more in between uses.   Once your skin is numb, you can end your treatment. You can test numbness by very lightly touching your skin. The touch should be so light that you do not see the skin dimple from the pressure of your fingertip. When using ice, most people will feel these normal sensations in this order: cold, burning, aching, and numbness.   Do not use ice on someone who cannot communicate their responses to pain, such as small children or people with dementia.  HOW TO MAKE AN ICE PACK Ice packs are the most common way to use ice therapy. Other methods include ice massage, ice baths, and cryo-sprays. Muscle creams that cause a cold, tingly feeling do not offer the same benefits that ice offers and should not be used as a substitute  unless recommended by your caregiver. To make an ice pack, do one of the following:  Place crushed ice or a bag of frozen vegetables in a sealable plastic bag. Squeeze out the excess air. Place this bag inside another plastic bag. Slide the bag into a pillowcase or place a damp towel between your skin and the bag.   Mix 3 parts water with 1 part rubbing alcohol. Freeze the mixture in a sealable plastic bag. When you remove the mixture from the freezer, it will be slushy. Squeeze out the excess air. Place this bag inside another plastic bag. Slide the bag into a pillowcase or place a damp towel between your skin  and the bag.  SEEK MEDICAL CARE IF:  You develop white spots on your skin. This may give the skin a blotchy (mottled) appearance.   Your skin turns blue or pale.   Your skin becomes waxy or hard.   Your swelling gets worse.  MAKE SURE YOU:   Understand these instructions.   Will watch your condition.   Will get help right away if you are not doing well or get worse.  Document Released: 08/31/2010 Document Revised: 12/24/2010 Document Reviewed: 08/31/2010 [employer] Patient InfContusion A contusion is a deep bruise. Contusions happen when an injury causes bleeding under the skin. Signs of bruising include pain, puffiness (swelling), and discolored skin. The contusion may turn blue, purple, or yellow. HOME CARE   Put ice on the injured area.   Put ice in a plastic bag.   Place a towel between your skin and the bag.   Leave the ice on for 15 to 20 minutes, 3 to 4 times a day.   Only take medicine as told by your doctor.   Rest the injured area.   If possible, raise (elevate) the injured area to lessen puffiness.  GET HELP RIGHT AWAY IF:   You have more bruising or puffiness.   You have pain that is getting worse.   Your puffiness or pain is not helped by medicine.  MAKE SURE YOU:   Understand these instructions.   Will watch your condition.   Will get help right away if you are not doing well or get worse.  Document Released: 06/23/2007 Document Revised: 12/24/2010 Document Reviewed: 11/09/2010 [facility] Patient Information [address]ormation 2012 [place], [place].
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
## 2011-04-20 NOTE — ED Notes (Signed)
Pt c/o right ankle pain. States he twisted his right ankle slid and fell off roof approximately 10-12 ft. Pt states he landed on his feet. Denies neck/back pain. c-collar placed. Denies hitting head/loc. Nad noted.
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
## 2011-04-20 NOTE — ED Notes (Signed)
Patient called out to request something else for pain. Per patient, ibuprofen did not help. Dr Rosalia Hammers aware.

## 2011-05-31 ENCOUNTER — Emergency Department (HOSPITAL_COMMUNITY): Payer: No Typology Code available for payment source

## 2011-05-31 ENCOUNTER — Other Ambulatory Visit: Payer: Self-pay

## 2011-05-31 ENCOUNTER — Emergency Department (HOSPITAL_COMMUNITY)
Admission: EM | Admit: 2011-05-31 | Discharge: 2011-05-31 | Disposition: A | Discharge status: Home / Self Care | Payer: No Typology Code available for payment source | Attending: Emergency Medicine | Admitting: Emergency Medicine

## 2011-05-31 ENCOUNTER — Encounter (HOSPITAL_COMMUNITY): Payer: Self-pay

## 2011-05-31 DIAGNOSIS — M549 Dorsalgia, unspecified: Secondary | ICD-10-CM | POA: Insufficient documentation

## 2011-05-31 DIAGNOSIS — I498 Other specified cardiac arrhythmias: Secondary | ICD-10-CM | POA: Insufficient documentation

## 2011-05-31 DIAGNOSIS — R55 Syncope and collapse: Secondary | ICD-10-CM | POA: Insufficient documentation

## 2011-05-31 DIAGNOSIS — R001 Bradycardia, unspecified: Secondary | ICD-10-CM

## 2011-05-31 DIAGNOSIS — M542 Cervicalgia: Secondary | ICD-10-CM | POA: Insufficient documentation

## 2011-05-31 DIAGNOSIS — H539 Unspecified visual disturbance: Secondary | ICD-10-CM | POA: Insufficient documentation

## 2011-05-31 DIAGNOSIS — Y92410 Unspecified street and highway as the place of occurrence of the external cause: Secondary | ICD-10-CM | POA: Insufficient documentation

## 2011-05-31 LAB — BASIC METABOLIC PANEL
BUN: 16 mg/dL (ref 6–23)
CO2: 28 mEq/L (ref 19–32)
Chloride: 104 mEq/L (ref 96–112)
Creatinine, Ser: 1.09 mg/dL (ref 0.50–1.35)
Glucose, Bld: 82 mg/dL (ref 70–99)

## 2011-05-31 LAB — DIFFERENTIAL
Basophils Absolute: 0 10*3/uL (ref 0.0–0.1)
Eosinophils Relative: 4 % (ref 0–5)
Neutrophils Relative %: 54 % (ref 43–77)

## 2011-05-31 LAB — RAPID URINE DRUG SCREEN, HOSP PERFORMED
Amphetamines: POSITIVE — AB
Benzodiazepines: POSITIVE — AB
Tetrahydrocannabinol: POSITIVE — AB

## 2011-05-31 LAB — CBC
MCH: 29.7 pg (ref 26.0–34.0)
MCHC: 33.2 g/dL (ref 30.0–36.0)
Platelets: 147 10*3/uL — ABNORMAL LOW (ref 150–400)
RDW: 13.7 % (ref 11.5–15.5)

## 2011-05-31 LAB — ETHANOL: Alcohol, Ethyl (B): <11 mg/dL (ref 0–11)

## 2011-05-31 MED ORDER — NALOXONE HCL 0.4 MG/ML IJ SOLN
0.4000 mg | Freq: Once | INTRAMUSCULAR | Status: AC
  Administered 2011-05-31: 0.4 mg via INTRAVENOUS
  Filled 2011-05-31: qty 1

## 2011-05-31 MED ORDER — FENTANYL CITRATE 0.05 MG/ML IJ SOLN
100.0000 ug | Freq: Once | INTRAMUSCULAR | Status: AC
  Administered 2011-05-31: 100 ug via INTRAVENOUS
  Filled 2011-05-31: qty 2
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
## 2011-05-31 NOTE — ED Notes (Signed)
Requesting food
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
## 2011-05-31 NOTE — Discharge Instructions (Signed)
Bradycardia Bradycardia is a term for a heart rate (pulse) that, in adults, is slower than 60 beats per minute. A normal rate is 60 to 100 beats per minute. A heart rate below 60 beats per minute may be normal for some adults with healthy hearts. If the rate is too slow, the heart may have trouble pumping the volume of blood the body needs. If the heart rate gets too low, blood flow to the brain may be decreased and may make you feel lightheaded, dizzy, or faint. The heart has a natural pacemaker in the top of the heart called the SA node (sinoatrial or sinus node). This pacemaker sends out regular electrical signals to the muscle of the heart, telling the heart muscle when to beat (contract). The electrical signal travels from the upper parts of the heart (atria) through the AV node (atrioventricular node), to the lower chambers of the heart (ventricles). The ventricles squeeze, pumping the blood from your heart to your lungs and to the rest of your body. CAUSES   Problem with the heart's electrical system.   Problem with the heart's natural pacemaker.   Heart disease, damage, or infection.   Medications.   Problems with minerals and salts (electrolytes).  SYMPTOMS   Fainting (syncope).   Fatigue and weakness.   Shortness of breath (dyspnea).   Chest pain (angina).   Drowsiness.   Confusion.  DIAGNOSIS   An electrocardiogram (ECG) can help your caregiver determine the type of slow heart rate you have.   If the cause is not seen on an ECG, you may need to wear a heart monitor that records your heart rhythm for several hours or days.   Blood tests.  TREATMENT   Electrolyte supplements.   Medications.   Withholding medication which is causing a slow heart rate.   Pacemaker placement.  SEEK IMMEDIATE MEDICAL CARE IF:   You feel lightheaded or faint.   You develop an irregular heart rate.   You feel chest pain or have trouble breathing.  MAKE SURE YOU:   Understand  these instructions.   Will watch your condition.   Will get help right away if you are not doing well or get worse.  Document Released: 09/26/2001 Document Revised: 12/24/2010 Document Reviewed: 08/23/2007 [facility] Patient Information 2012 [place], [place].Bradycardia Bradycardia is a term for a heart rate (pulse) that, in adults, is slower than 60 beats per minute. A normal rate is 60 to 100 beats per minute. A heart rate below 60 beats per minute may be normal for some adults with healthy hearts. If the rate is too slow, the heart may have trouble pumping the volume of blood the body needs. If the heart rate gets too low, blood flow to the brain may be decreased and may make you feel lightheaded, dizzy, or faint. The heart has a natural pacemaker in the top of the heart called the SA node (sinoatrial or sinus node). This pacemaker sends out regular electrical signals to the muscle of the heart, telling the heart muscle when to beat (contract). The electrical signal travels from the upper parts of the heart (atria) through the AV node (atrioventricular node), to the lower chambers of the heart (ventricles). The ventricles squeeze, pumping the blood from your heart to your lungs and to the rest of your body. CAUSES   Problem with the heart's electrical system.   Problem with the heart's natural pacemaker.   Heart disease, damage, or infection.   Medications.   Problems  with minerals and salts (electrolytes).  SYMPTOMS   Fainting (syncope).   Fatigue and weakness.   Shortness of breath (dyspnea).   Chest pain (angina).   Drowsiness.   Confusion.  DIAGNOSIS   An electrocardiogram (ECG) can help your caregiver determine the type of slow heart rate you have.   If the cause is not seen on an ECG, you may need to wear a heart monitor that records your heart rhythm for several hours or days.   Blood tests.  TREATMENT   Electrolyte supplements.   Medications.   Withholding  medication which is causing a slow heart rate.   Pacemaker placement.  SEEK IMMEDIATE MEDICAL CARE IF:   You feel lightheaded or faint.   You develop an irregular heart rate.   You feel chest pain or have trouble breathing.  MAKE SURE YOU:   Understand these instructions.   Will watch your condition.   Will get help right away if you are not doing well or get worse.  Document Released: 09/26/2001 Document Revised: 12/24/2010 Document Reviewed: 08/23/2007 [facility] Patient Information 2012 [place], [place].

## 2011-05-31 NOTE — ED Notes (Signed)
Family at bedside. 

## 2011-05-31 NOTE — ED Notes (Signed)
Pt given pain medication and pt had moment of unresponsiveness. Pt oxygen and bp normal. Heart rate 40s. EDP notified.

## 2011-05-31 NOTE — ED Notes (Signed)
Pt c/o pain from the waist up.

## 2011-06-14 ENCOUNTER — Emergency Department (HOSPITAL_COMMUNITY)
Admission: EM | Admit: 2011-06-14 | Discharge: 2011-06-14 | Disposition: A | Discharge status: Home / Self Care | Payer: Self-pay | Attending: Emergency Medicine | Admitting: Emergency Medicine

## 2011-06-14 ENCOUNTER — Emergency Department (HOSPITAL_COMMUNITY): Payer: Self-pay

## 2011-06-14 ENCOUNTER — Encounter (HOSPITAL_COMMUNITY): Payer: Self-pay | Admitting: *Deleted

## 2011-06-14 DIAGNOSIS — F319 Bipolar disorder, unspecified: Secondary | ICD-10-CM | POA: Insufficient documentation

## 2011-06-14 DIAGNOSIS — R071 Chest pain on breathing: Secondary | ICD-10-CM | POA: Insufficient documentation

## 2011-06-14 DIAGNOSIS — Z79899 Other long term (current) drug therapy: Secondary | ICD-10-CM | POA: Insufficient documentation

## 2011-06-14 DIAGNOSIS — R0789 Other chest pain: Secondary | ICD-10-CM

## 2011-06-14 DIAGNOSIS — R4182 Altered mental status, unspecified: Secondary | ICD-10-CM | POA: Insufficient documentation

## 2011-06-14 LAB — URINALYSIS, ROUTINE W REFLEX MICROSCOPIC
Ketones, ur: NEGATIVE mg/dL
Leukocytes, UA: NEGATIVE
Nitrite: NEGATIVE
Protein, ur: NEGATIVE mg/dL

## 2011-06-14 LAB — COMPREHENSIVE METABOLIC PANEL
ALT: 10 U/L (ref 0–53)
AST: 16 U/L (ref 0–37)
Calcium: 9.6 mg/dL (ref 8.4–10.5)
Sodium: 138 mEq/L (ref 135–145)
Total Protein: 7 g/dL (ref 6.0–8.3)

## 2011-06-14 LAB — ETHANOL: Alcohol, Ethyl (B): <11 mg/dL (ref 0–11)

## 2011-06-14 LAB — CBC
MCH: 29.2 pg (ref 26.0–34.0)
MCV: 88.9 fL (ref 78.0–100.0)
Platelets: 179 10*3/uL (ref 150–400)
RDW: 13.3 % (ref 11.5–15.5)
WBC: 7.4 10*3/uL (ref 4.0–10.5)

## 2011-06-14 LAB — DIFFERENTIAL
Basophils Absolute: 0 10*3/uL (ref 0.0–0.1)
Eosinophils Absolute: 0.2 10*3/uL (ref 0.0–0.7)
Eosinophils Relative: 2 % (ref 0–5)
Lymphocytes Relative: 25 % (ref 12–46)
Neutrophils Relative %: 67 % (ref 43–77)

## 2011-06-14 LAB — RAPID URINE DRUG SCREEN, HOSP PERFORMED
Amphetamines: NOT DETECTED
Barbiturates: NOT DETECTED
Benzodiazepines: POSITIVE — AB
Tetrahydrocannabinol: NOT DETECTED

## 2011-06-14 LAB — ACETAMINOPHEN LEVEL: Acetaminophen (Tylenol), Serum: <15 ug/mL (ref 10–30)

## 2011-06-14 LAB — SALICYLATE LEVEL: Salicylate Lvl: <2 mg/dL — ABNORMAL LOW (ref 2.8–20.0)

## 2011-06-14 MED ORDER — NALOXONE HCL 0.4 MG/ML IJ SOLN
0.4000 mg | Freq: Once | INTRAMUSCULAR | Status: AC
  Administered 2011-06-14: 0.4 mg via INTRAVENOUS

## 2011-06-14 MED ORDER — TRAMADOL HCL 50 MG PO TABS: 50.0000 mg | ORAL_TABLET | Freq: Four times a day (QID) | ORAL | Status: AC | PRN

## 2011-06-14 MED ORDER — IBUPROFEN 800 MG PO TABS
800.0000 mg | ORAL_TABLET | Freq: Once | ORAL | Status: DC
  Filled 2011-06-14: qty 1

## 2011-06-14 MED ORDER — NALOXONE HCL 0.4 MG/ML IJ SOLN
INTRAMUSCULAR | Status: AC
  Administered 2011-06-14: 0.4 mg via INTRAVENOUS
  Filled 2011-06-14: qty 1

## 2011-06-14 MED ORDER — LORAZEPAM 2 MG/ML IJ SOLN
INTRAMUSCULAR | Status: AC
  Administered 2011-06-14: 1 mg
  Filled 2011-06-14: qty 1

## 2011-06-14 MED ORDER — KETOROLAC TROMETHAMINE 30 MG/ML IJ SOLN
30.0000 mg | Freq: Once | INTRAMUSCULAR | Status: AC
  Administered 2011-06-14: 30 mg via INTRAVENOUS
  Filled 2011-06-14: qty 1

## 2011-06-14 MED ORDER — SODIUM CHLORIDE 0.9 % IV BOLUS (SEPSIS)
1000.0000 mL | Freq: Once | INTRAVENOUS | Status: AC
  Administered 2011-06-14: 1000 mL via INTRAVENOUS

## 2011-06-14 NOTE — ED Notes (Signed)
Pt left ER with family; security escorted for safety Pt cooperative with discharge;

## 2011-06-14 NOTE — ED Notes (Signed)
Pt yelling out agitated cursing at staff; Informed pt that that type of language would not be tolerated; Pt stating that staff were cursing him; Security called to stand by; MD informed. Family allowed to come back to see pt; At this time, pt not yelling out; Security in hall outside room;

## 2011-06-14 NOTE — ED Notes (Signed)
Pt educated at Toradol; pt stated "I want to feel something, push it fast" Informed pt that it was not a narcotic; he stated that he still wanted me to push it fast to feel the medication. After admisration of med; pt stated that he wanted a different kind of med; also pt requesting MD to write a prescription for him to take home with him.

## 2011-06-14 NOTE — ED Notes (Signed)
Pt arrived by EMS; with c/o CP

## 2011-06-14 NOTE — Discharge Instructions (Signed)
Chest Wall Pain There is no evidence of a heart attack or blood clot in the lung.  Follow up with your doctor this week. Return to the ED if you develop new or worsening symptoms. Chest wall pain is pain in or around the bones and muscles of your chest. It may take up to 6 weeks to get better. It may take longer if you must stay physically active in your work and activities.  CAUSES  Chest wall pain may happen on its own. However, it may be caused by:  A viral illness like the flu.   Injury.   Coughing.   Exercise.   Arthritis.   Fibromyalgia.   Shingles.  HOME CARE INSTRUCTIONS   Avoid overtiring physical activity. Try not to strain or perform activities that cause pain. This includes any activities using your chest or your abdominal and side muscles, especially if heavy weights are used.   Put ice on the sore area.   Put ice in a plastic bag.   Place a towel between your skin and the bag.   Leave the ice on for 15 to 20 minutes per hour while awake for the first 2 days.   Only take over-the-counter or prescription medicines for pain, discomfort, or fever as directed by your caregiver.  SEEK IMMEDIATE MEDICAL CARE IF:   Your pain increases, or you are very uncomfortable.   You have a fever.   Your chest pain becomes worse.   You have new, unexplained symptoms.   You have nausea or vomiting.   You feel sweaty or lightheaded.   You have a cough with phlegm (sputum), or you cough up blood.  MAKE SURE YOU:   Understand these instructions.   Will watch your condition.   Will get help right away if you are not doing well or get worse.  Document Released: 01/04/2005 Document Revised: 12/24/2010 Document Reviewed: 08/31/2010 [facility] Patient Information 2012 [place], [place].

## 2011-06-14 NOTE — ED Notes (Signed)
Pt refusing to take ibuprofen; pt states that "Motrin doesn't work I have that at home!" Explained to pt that he could not receive anything else right now because he became unresponsive earlier" Pt states that he was not unresponsive that he "was just thinking" Explained to pt that the Dr had to tx on his assessment; Pt became demanding stating that he wanted to talk to the Dr "right now!!!" Notified MD

## 2011-06-14 NOTE — ED Notes (Signed)
RN called to room by registration; pt was unresponsive; Respirations shallow; pt not responding to stimuli would not respond to ex sternal rub; blank stare on face; MD called to room. Narcan 0.4mg  IV  given at 1220, Pt became responsive; disoriented to place; reoriented easily without problems; c/o chest and upper L shoulder pain.
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
## 2012-03-13 ENCOUNTER — Emergency Department (HOSPITAL_COMMUNITY)
Admission: EM | Admit: 2012-03-13 | Discharge: 2012-03-13 | Disposition: A | Discharge status: Home / Self Care | Payer: Self-pay

## 2012-03-13 NOTE — ED Notes (Signed)
Call no answer 

## 2012-04-13 ENCOUNTER — Emergency Department (HOSPITAL_COMMUNITY): Payer: Self-pay

## 2012-04-13 ENCOUNTER — Emergency Department (HOSPITAL_COMMUNITY)
Admission: EM | Admit: 2012-04-13 | Discharge: 2012-04-13 | Disposition: A | Discharge status: Home / Self Care | Payer: Self-pay | Attending: Emergency Medicine | Admitting: Emergency Medicine

## 2012-04-13 ENCOUNTER — Encounter (HOSPITAL_COMMUNITY): Payer: Self-pay | Admitting: *Deleted

## 2012-04-13 DIAGNOSIS — Z8719 Personal history of other diseases of the digestive system: Secondary | ICD-10-CM | POA: Insufficient documentation

## 2012-04-13 DIAGNOSIS — F17200 Nicotine dependence, unspecified, uncomplicated: Secondary | ICD-10-CM | POA: Insufficient documentation

## 2012-04-13 DIAGNOSIS — Z8659 Personal history of other mental and behavioral disorders: Secondary | ICD-10-CM | POA: Insufficient documentation

## 2012-04-13 DIAGNOSIS — K429 Umbilical hernia without obstruction or gangrene: Secondary | ICD-10-CM | POA: Insufficient documentation

## 2012-04-13 DIAGNOSIS — Z8614 Personal history of Methicillin resistant Staphylococcus aureus infection: Secondary | ICD-10-CM | POA: Insufficient documentation

## 2012-04-13 DIAGNOSIS — Z87448 Personal history of other diseases of urinary system: Secondary | ICD-10-CM | POA: Insufficient documentation

## 2012-04-13 HISTORY — DX: Gastro-esophageal reflux disease without esophagitis: K21.9

## 2012-04-13 LAB — BASIC METABOLIC PANEL
BUN: 16 mg/dL (ref 6–23)
Calcium: 9.9 mg/dL (ref 8.4–10.5)
Chloride: 99 mEq/L (ref 96–112)
Creatinine, Ser: 1.03 mg/dL (ref 0.50–1.35)
GFR calc Af Amer: >90 mL/min (ref >90)
GFR calc non Af Amer: >90 mL/min (ref >90)

## 2012-04-13 LAB — URINALYSIS, ROUTINE W REFLEX MICROSCOPIC
Bilirubin Urine: NEGATIVE
Ketones, ur: NEGATIVE mg/dL
Nitrite: NEGATIVE
Protein, ur: NEGATIVE mg/dL
pH: 7 (ref 5.0–8.0)

## 2012-04-13 LAB — CBC
HCT: 42.3 % (ref 39.0–52.0)
MCH: 30.3 pg (ref 26.0–34.0)
MCHC: 35 g/dL (ref 30.0–36.0)
MCV: 86.7 fL (ref 78.0–100.0)
RDW: 13.4 % (ref 11.5–15.5)

## 2012-04-13 MED ORDER — IOHEXOL 300 MG/ML  SOLN
50.0000 mL | Freq: Once | INTRAMUSCULAR | Status: AC | PRN
  Administered 2012-04-13: 50 mL via ORAL

## 2012-04-13 MED ORDER — ONDANSETRON HCL 4 MG/2ML IJ SOLN
4.0000 mg | Freq: Once | INTRAMUSCULAR | Status: AC
  Administered 2012-04-13: 4 mg via INTRAVENOUS
  Filled 2012-04-13: qty 2

## 2012-04-13 MED ORDER — IOHEXOL 300 MG/ML  SOLN
100.0000 mL | Freq: Once | INTRAMUSCULAR | Status: AC | PRN
  Administered 2012-04-13: 100 mL via INTRAVENOUS

## 2012-04-13 MED ORDER — HYDROMORPHONE HCL PF 1 MG/ML IJ SOLN
1.0000 mg | Freq: Once | INTRAMUSCULAR | Status: AC
  Administered 2012-04-13: 1 mg via INTRAVENOUS
  Filled 2012-04-13: qty 1

## 2012-04-13 MED ORDER — SODIUM CHLORIDE 0.9 % IV BOLUS (SEPSIS)
500.0000 mL | Freq: Once | INTRAVENOUS | Status: AC
  Administered 2012-04-13: 18:00:00 via INTRAVENOUS

## 2012-04-13 MED ORDER — HYDROMORPHONE HCL PF 1 MG/ML IJ SOLN
1.0000 mg | INTRAMUSCULAR | Status: DC | PRN
  Administered 2012-04-13 (×2): 1 mg via INTRAVENOUS
  Filled 2012-04-13 (×2): qty 1

## 2012-04-13 MED ORDER — BISACODYL 5 MG PO TBEC: 5.0000 mg | DELAYED_RELEASE_TABLET | Freq: Two times a day (BID) | ORAL | Status: DC

## 2012-04-13 MED ORDER — HYDROCODONE-ACETAMINOPHEN 5-325 MG PO TABS: 1.0000 | ORAL_TABLET | Freq: Four times a day (QID) | ORAL | Status: DC | PRN

## 2012-04-13 MED ORDER — SODIUM CHLORIDE 0.9 % IV SOLN
INTRAVENOUS | Status: DC
  Administered 2012-04-13: 19:00:00 via INTRAVENOUS

## 2012-04-13 NOTE — ED Notes (Signed)
Pt from home with reports of pain and a "knot" in the umbilical area that has worsened over the last 10 days. 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

## 2012-04-13 NOTE — ED Provider Notes (Signed)
History     CSN: [id number]  Arrival date & time 04/13/12  1654   First MD Initiated Contact with Patient 04/13/12 1720      Chief Complaint  Patient presents with  . Abdominal Pain    (Consider location/radiation/quality/duration/timing/severity/associated sxs/prior treatment) HPI Comments: Chad Black is a 27 y.o. male presents emergency department with chief complaint of abdominal pain.  Onset of symptoms began approximately 10 days ago and is located in the periumbilical region.  Patient is not aware of what triggered the onset however he noticed a bulge just above his bellybutton.  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  Symptoms are moderate in rated at 10/10 in severity.  Symptoms have been worsening since onset and so they became unbearable today while at work. He denies any nausea, vomiting, diarrhea, appetite change, weight loss, chest pain, fever night sweats or chills.  Patient is a 27 y.o. male presenting with abdominal pain. The history is provided by the patient.  Abdominal Pain   Past Medical History  Diagnosis Date  . Manic depression   . Testicular torsion   . MRSA (methicillin resistant Staphylococcus aureus)   . GERD (gastroesophageal reflux disease)     Past Surgical History  Procedure Laterality Date  . Testicle surgery      Family History  Problem Relation Age of Onset  . Heart attack Father 75    Died    History  Substance Use Topics  . Smoking status: Current Every Day Smoker -- 0.50 packs/day    Types: Cigarettes  . Smokeless tobacco: Current User  . Alcohol Use: Yes     Comment: occasionally      Review of Systems  Gastrointestinal: Positive for abdominal pain.  All other systems reviewed and are negative.    Allergies  Morphine and related and Sulfa antibiotics  Home Medications   Current Outpatient Rx  Name  Route  Sig  Dispense  Refill  . PRESCRIPTION  MEDICATION   Both Eyes   Place 1 drop into both eyes daily. Eye drop patient states that he gets from [facility].           BP 121/66  Pulse 64  Temp(Src) 98.9 F (37.2 C) (Oral)  Resp 18  SpO2 98%  Physical Exam  Nursing note and vitals reviewed. Constitutional: He is oriented to person, place, and time. He appears well-developed and well-nourished. He appears distressed.  HENT:  Head: Normocephalic and atraumatic.  Eyes: Conjunctivae and EOM are normal.  Neck: Normal range of motion.  Cardiovascular:  Regular rate rhythm.  Distal pulses  Pulmonary/Chest: Effort normal.  Lungs good off rotation bilaterally  Abdominal:  Significant umbilical tenderness to palpation with palpable bulge/knot with possible palpable pulse.  Musculoskeletal: Normal range of motion.  Neurological: He is alert and oriented to person, place, and time.  Skin: Skin is warm and dry. No rash noted. He is not diaphoretic.  Psychiatric: He has a normal mood and affect. His behavior is normal.    ED Course  Procedures (including critical care time)  Labs Reviewed  CBC - Abnormal; Notable for the following:    WBC 11.0 (*)    All other components within normal limits  URINALYSIS, ROUTINE W REFLEX MICROSCOPIC - Abnormal; Notable for the following:    APPearance CLOUDY (*)    All other components within normal limits  BASIC METABOLIC PANEL  Ct Abdomen Pelvis W Contrast  04/13/2012  *RADIOLOGY REPORT*  Clinical Data: Focal umbilical swelling.  Pain.  CT ABDOMEN AND PELVIS WITH CONTRAST  Technique:  Multidetector CT imaging of the abdomen and pelvis was performed following the standard protocol during bolus administration of intravenous contrast.  Contrast: OMNIPAQUE IOHEXOL 300 MG/ML  SOLN, 50mL OMNIPAQUE IOHEXOL 300 MG/ML  SOLN  Comparison: 05/27/2010  Findings: The liver, spleen, pancreas, and adrenal glands appear unremarkable.  Contracted gallbladder noted. The kidneys appear unremarkable,  as do the proximal ureters.  No pathologic retroperitoneal or porta hepatis adenopathy is identified.  Appendix normal.  A small umbilical hernia contains adipose tissue with subtle stranding on image 57 of series 2.  No bowel herniation observed. Several loops of contrast filled distal jejunum in the left lower quadrant are in the upper normal size range at 2.6 cm in diameter. No definite wall thickening.  The contrast column does not extend significantly beyond this point.  Prominent stool in the proximal and distal colon spares the transverse colon.  No pathologic pelvic adenopathy is identified.  No free pelvic fluid noted.  Mild focal wall thickening in the distal sigmoid colon on image 79 of series 2 may be secondary to peristalsis given the reduced caliber of the bowel.  IMPRESSION:  1.  Very small umbilical hernia contains adipose tissue but no bowel. 2.  Prominence of stool in the colon likely reflects constipation.   Original Report Authenticated By: Gaylyn Rong, M.D.      No diagnosis found.  BP 121/66  Pulse 64  Temp(Src) 98.9 F (37.2 C) (Oral)  Resp 18  SpO2 98%   MDM  Patient with umbilical hernia.  Outpatient surgical followup recommended.  Due to stool in colon will discharge with pain medication as well as stool softener.  Work note given.  Results discussed with patient.  Pain managed in emergency department.  At this time there does not appear to be any evidence of an acute emergency medical condition and the patient appears stable for discharge with appropriate outpatient follow up.Diagnosis was discussed with patient who verbalizes understanding and is agreeable to discharge.         Jaci Carrel, [place] 04/13/12 2018

## 2012-04-14 NOTE — ED Provider Notes (Signed)
Medical screening examination/treatment/procedure(s) were performed by non-physician practitioner and as supervising physician I was immediately available for consultation/collaboration.  Juliet Rude. Rubin Payor, MD 04/14/12 [id number]

## 2012-08-18 ENCOUNTER — Emergency Department (HOSPITAL_COMMUNITY)
Admission: EM | Admit: 2012-08-18 | Discharge: 2012-08-18 | Disposition: A | Discharge status: Home / Self Care | Payer: Self-pay | Attending: Emergency Medicine | Admitting: Emergency Medicine

## 2012-08-18 ENCOUNTER — Encounter (HOSPITAL_COMMUNITY): Payer: Self-pay | Admitting: *Deleted

## 2012-08-18 ENCOUNTER — Emergency Department (HOSPITAL_COMMUNITY): Payer: Self-pay

## 2012-08-18 DIAGNOSIS — Z79899 Other long term (current) drug therapy: Secondary | ICD-10-CM | POA: Insufficient documentation

## 2012-08-18 DIAGNOSIS — F17200 Nicotine dependence, unspecified, uncomplicated: Secondary | ICD-10-CM | POA: Insufficient documentation

## 2012-08-18 DIAGNOSIS — Y929 Unspecified place or not applicable: Secondary | ICD-10-CM | POA: Insufficient documentation

## 2012-08-18 DIAGNOSIS — Z87448 Personal history of other diseases of urinary system: Secondary | ICD-10-CM | POA: Insufficient documentation

## 2012-08-18 DIAGNOSIS — Y9389 Activity, other specified: Secondary | ICD-10-CM | POA: Insufficient documentation

## 2012-08-18 DIAGNOSIS — K429 Umbilical hernia without obstruction or gangrene: Secondary | ICD-10-CM | POA: Insufficient documentation

## 2012-08-18 DIAGNOSIS — Z8614 Personal history of Methicillin resistant Staphylococcus aureus infection: Secondary | ICD-10-CM | POA: Insufficient documentation

## 2012-08-18 DIAGNOSIS — Z8719 Personal history of other diseases of the digestive system: Secondary | ICD-10-CM | POA: Insufficient documentation

## 2012-08-18 DIAGNOSIS — Z9889 Other specified postprocedural states: Secondary | ICD-10-CM | POA: Insufficient documentation

## 2012-08-18 DIAGNOSIS — S3981XA Other specified injuries of abdomen, initial encounter: Secondary | ICD-10-CM | POA: Insufficient documentation

## 2012-08-18 DIAGNOSIS — IMO0002 Reserved for concepts with insufficient information to code with codable children: Secondary | ICD-10-CM | POA: Insufficient documentation

## 2012-08-18 DIAGNOSIS — R109 Unspecified abdominal pain: Secondary | ICD-10-CM

## 2012-08-18 LAB — CBC WITH DIFFERENTIAL/PLATELET
Basophils Absolute: 0 10*3/uL (ref 0.0–0.1)
Basophils Relative: 1 % (ref 0–1)
Eosinophils Absolute: 0.2 10*3/uL (ref 0.0–0.7)
Eosinophils Relative: 3 % (ref 0–5)
HCT: 40.7 % (ref 39.0–52.0)
Hemoglobin: 13.8 g/dL (ref 13.0–17.0)
Lymphocytes Relative: 34 % (ref 12–46)
Lymphs Abs: 2 10*3/uL (ref 0.7–4.0)
MCH: 30.4 pg (ref 26.0–34.0)
MCHC: 33.9 g/dL (ref 30.0–36.0)
MCV: 89.6 fL (ref 78.0–100.0)
Monocytes Absolute: 0.4 10*3/uL (ref 0.1–1.0)
Monocytes Relative: 6 % (ref 3–12)
Neutro Abs: 3.4 10*3/uL (ref 1.7–7.7)
Neutrophils Relative %: 56 % (ref 43–77)
Platelets: 172 10*3/uL (ref 150–400)
RBC: 4.54 MIL/uL (ref 4.22–5.81)
RDW: 13.6 % (ref 11.5–15.5)
WBC: 6.1 10*3/uL (ref 4.0–10.5)

## 2012-08-18 LAB — COMPREHENSIVE METABOLIC PANEL
ALT: 12 U/L (ref 0–53)
AST: 15 U/L (ref 0–37)
Albumin: 4.1 g/dL (ref 3.5–5.2)
Alkaline Phosphatase: 57 U/L (ref 39–117)
BUN: 13 mg/dL (ref 6–23)
CO2: 31 mEq/L (ref 19–32)
Calcium: 9.9 mg/dL (ref 8.4–10.5)
Chloride: 100 mEq/L (ref 96–112)
Creatinine, Ser: 1.07 mg/dL (ref 0.50–1.35)
GFR calc Af Amer: >90 mL/min (ref >90)
GFR calc non Af Amer: >90 mL/min (ref >90)
Glucose, Bld: 79 mg/dL (ref 70–99)
Potassium: 4.1 mEq/L (ref 3.5–5.1)
Sodium: 138 mEq/L (ref 135–145)
Total Bilirubin: 0.2 mg/dL — ABNORMAL LOW (ref 0.3–1.2)
Total Protein: 7 g/dL (ref 6.0–8.3)

## 2012-08-18 LAB — LIPASE, BLOOD: Lipase: 37 U/L (ref 11–59)

## 2012-08-18 MED ORDER — IOHEXOL 300 MG/ML  SOLN
100.0000 mL | Freq: Once | INTRAMUSCULAR | Status: AC | PRN
  Administered 2012-08-18: 100 mL via INTRAVENOUS

## 2012-08-18 MED ORDER — TRAMADOL HCL 50 MG PO TABS: 50.0000 mg | ORAL_TABLET | Freq: Four times a day (QID) | ORAL | Status: DC | PRN

## 2012-08-18 MED ORDER — IOHEXOL 300 MG/ML  SOLN
50.0000 mL | Freq: Once | INTRAMUSCULAR | Status: AC | PRN
  Administered 2012-08-18: 50 mL via ORAL

## 2012-08-18 MED ORDER — KETOROLAC TROMETHAMINE 30 MG/ML IJ SOLN
30.0000 mg | Freq: Once | INTRAMUSCULAR | Status: AC
  Administered 2012-08-18: 30 mg via INTRAVENOUS
  Filled 2012-08-18: qty 1

## 2012-08-18 MED ORDER — ONDANSETRON HCL 4 MG/2ML IJ SOLN
4.0000 mg | Freq: Once | INTRAMUSCULAR | Status: AC
  Administered 2012-08-18: 4 mg via INTRAVENOUS
  Filled 2012-08-18: qty 2

## 2012-08-18 MED ORDER — SODIUM CHLORIDE 0.9 % IV BOLUS (SEPSIS)
1000.0000 mL | Freq: Once | INTRAVENOUS | Status: AC
  Administered 2012-08-18: 1000 mL via INTRAVENOUS

## 2012-08-18 MED ORDER — LORAZEPAM 2 MG/ML IJ SOLN
0.5000 mg | Freq: Once | INTRAMUSCULAR | Status: AC
  Administered 2012-08-18: 18:00:00 via INTRAVENOUS
  Filled 2012-08-18: qty 1

## 2012-08-18 MED ORDER — HYDROMORPHONE HCL PF 1 MG/ML IJ SOLN
1.0000 mg | Freq: Once | INTRAMUSCULAR | Status: AC
  Administered 2012-08-18: 1 mg via INTRAVENOUS
  Filled 2012-08-18: qty 1
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
## 2012-08-18 NOTE — ED Notes (Signed)
Pt alert & oriented x4, stable gait. Patient given discharge instructions, paperwork & prescription(s). Patient  instructed to stop at the registration desk to finish any additional paperwork. Patient verbalized understanding. Pt left department w/ no further questions. 
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
## 2013-07-02 ENCOUNTER — Emergency Department (HOSPITAL_COMMUNITY): Payer: Medicaid Other

## 2013-07-02 ENCOUNTER — Encounter (HOSPITAL_COMMUNITY): Payer: Self-pay | Admitting: Emergency Medicine

## 2013-07-02 ENCOUNTER — Emergency Department (HOSPITAL_COMMUNITY)
Admission: EM | Admit: 2013-07-02 | Discharge: 2013-07-02 | Disposition: A | Discharge status: Home / Self Care | Payer: Medicaid Other | Attending: Emergency Medicine | Admitting: Emergency Medicine

## 2013-07-02 DIAGNOSIS — R109 Unspecified abdominal pain: Secondary | ICD-10-CM

## 2013-07-02 DIAGNOSIS — K429 Umbilical hernia without obstruction or gangrene: Secondary | ICD-10-CM | POA: Insufficient documentation

## 2013-07-02 DIAGNOSIS — Z87448 Personal history of other diseases of urinary system: Secondary | ICD-10-CM | POA: Insufficient documentation

## 2013-07-02 DIAGNOSIS — F17200 Nicotine dependence, unspecified, uncomplicated: Secondary | ICD-10-CM | POA: Insufficient documentation

## 2013-07-02 DIAGNOSIS — Z8614 Personal history of Methicillin resistant Staphylococcus aureus infection: Secondary | ICD-10-CM | POA: Insufficient documentation

## 2013-07-02 DIAGNOSIS — K5900 Constipation, unspecified: Secondary | ICD-10-CM | POA: Insufficient documentation

## 2013-07-02 LAB — URINALYSIS, ROUTINE W REFLEX MICROSCOPIC
Bilirubin Urine: NEGATIVE
Glucose, UA: NEGATIVE mg/dL
Hgb urine dipstick: NEGATIVE
KETONES UR: NEGATIVE mg/dL
LEUKOCYTES UA: NEGATIVE
NITRITE: NEGATIVE
PROTEIN: NEGATIVE mg/dL
Specific Gravity, Urine: 1.018 (ref 1.005–1.030)
Urobilinogen, UA: 0.2 mg/dL (ref 0.0–1.0)
pH: 7 (ref 5.0–8.0)

## 2013-07-02 LAB — CBC WITH DIFFERENTIAL/PLATELET
Basophils Absolute: 0 10*3/uL (ref 0.0–0.1)
Basophils Relative: 0 % (ref 0–1)
EOS ABS: 0.2 10*3/uL (ref 0.0–0.7)
Eosinophils Relative: 2 % (ref 0–5)
HCT: 38.7 % — ABNORMAL LOW (ref 39.0–52.0)
HEMOGLOBIN: 12.7 g/dL — AB (ref 13.0–17.0)
LYMPHS ABS: 1.8 10*3/uL (ref 0.7–4.0)
Lymphocytes Relative: 29 % (ref 12–46)
MCH: 29 pg (ref 26.0–34.0)
MCHC: 32.8 g/dL (ref 30.0–36.0)
MCV: 88.4 fL (ref 78.0–100.0)
MONOS PCT: 8 % (ref 3–12)
Monocytes Absolute: 0.5 10*3/uL (ref 0.1–1.0)
NEUTROS ABS: 3.9 10*3/uL (ref 1.7–7.7)
NEUTROS PCT: 61 % (ref 43–77)
Platelets: 148 10*3/uL — ABNORMAL LOW (ref 150–400)
RBC: 4.38 MIL/uL (ref 4.22–5.81)
RDW: 13.7 % (ref 11.5–15.5)
WBC: 6.4 10*3/uL (ref 4.0–10.5)

## 2013-07-02 LAB — COMPREHENSIVE METABOLIC PANEL
ALK PHOS: 53 U/L (ref 39–117)
ALT: 13 U/L (ref 0–53)
AST: 16 U/L (ref 0–37)
Albumin: 3.9 g/dL (ref 3.5–5.2)
BUN: 16 mg/dL (ref 6–23)
CHLORIDE: 104 meq/L (ref 96–112)
CO2: 25 meq/L (ref 19–32)
Calcium: 9.1 mg/dL (ref 8.4–10.5)
Creatinine, Ser: 0.91 mg/dL (ref 0.50–1.35)
GLUCOSE: 83 mg/dL (ref 70–99)
POTASSIUM: 4 meq/L (ref 3.7–5.3)
Sodium: 141 mEq/L (ref 137–147)
Total Protein: 6.5 g/dL (ref 6.0–8.3)

## 2013-07-02 MED ORDER — HYDROCODONE-ACETAMINOPHEN 5-325 MG PO TABS: ORAL_TABLET | ORAL | Status: DC

## 2013-07-02 MED ORDER — HYDROMORPHONE HCL PF 1 MG/ML IJ SOLN
1.0000 mg | Freq: Once | INTRAMUSCULAR | Status: AC
  Administered 2013-07-02: 1 mg via INTRAVENOUS
  Filled 2013-07-02: qty 1

## 2013-07-02 MED ORDER — IOHEXOL 300 MG/ML  SOLN
50.0000 mL | Freq: Once | INTRAMUSCULAR | Status: AC | PRN
  Administered 2013-07-02: 50 mL via ORAL

## 2013-07-02 MED ORDER — IOHEXOL 300 MG/ML  SOLN
100.0000 mL | Freq: Once | INTRAMUSCULAR | Status: AC | PRN
Start: 2013-07-02 — End: 2013-07-02
  Administered 2013-07-02: 100 mL via INTRAVENOUS

## 2013-07-02 MED ORDER — SODIUM CHLORIDE 0.9 % IV BOLUS (SEPSIS)
1000.0000 mL | Freq: Once | INTRAVENOUS | Status: AC
  Administered 2013-07-02: 1000 mL via INTRAVENOUS
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
## 2013-07-02 NOTE — ED Notes (Signed)
Bed: [id number] Expected date:  Expected time:  Means of arrival:  Comments: Triage 1 Amis

## 2013-07-02 NOTE — ED Notes (Signed)
Pt provided additional apple juice per request.

## 2013-07-02 NOTE — Discharge Instructions (Signed)
Take vicodin for breakthrough pain, do not drink alcohol, drive, care for children or do other critical tasks while taking vicodin.  Please follow with your primary care doctor in the next 2 days for a check-up. They must obtain records for further management.   Do not hesitate to return to the Emergency Department for any new, worsening or concerning symptoms.   Abdominal Pain, Adult Many things can cause abdominal pain. Usually, abdominal pain is not caused by a disease and will improve without treatment. It can often be observed and treated at home. Your health care provider will do a physical exam and possibly order blood tests and X-rays to help determine the seriousness of your pain. However, in many cases, more time must pass before a clear cause of the pain can be found. Before that point, your health care provider may not know if you need more testing or further treatment. HOME CARE INSTRUCTIONS  Monitor your abdominal pain for any changes. The following actions may help to alleviate any discomfort you are experiencing:  Only take over-the-counter or prescription medicines as directed by your health care provider.  Do not take laxatives unless directed to do so by your health care provider.  Try a clear liquid diet (broth, tea, or water) as directed by your health care provider. Slowly move to a bland diet as tolerated. SEEK MEDICAL CARE IF:  You have unexplained abdominal pain.  You have abdominal pain associated with nausea or diarrhea.  You have pain when you urinate or have a bowel movement.  You experience abdominal pain that wakes you in the night.  You have abdominal pain that is worsened or improved by eating food.  You have abdominal pain that is worsened with eating fatty foods. SEEK IMMEDIATE MEDICAL CARE IF:   Your pain does not go away within 2 hours.  You have a fever.  You keep throwing up (vomiting).  Your pain is felt only in portions of the abdomen,  such as the right side or the left lower portion of the abdomen.  You pass bloody or black tarry stools. MAKE SURE YOU:  Understand these instructions.   Will watch your condition.   Will get help right away if you are not doing well or get worse.  Document Released: 10/14/2004 Document Revised: 10/25/2012 Document Reviewed: 09/13/2012 [facility] Patient Information 2014 [place], [place].

## 2013-07-02 NOTE — ED Notes (Signed)
Pt has hx of testicular torsion.  Pt is having scrotal pain at this time.  Pain does radiate from abdomen.

## 2013-07-02 NOTE — ED Notes (Signed)
Rubye OaksPalmer, PA at bedside.

## 2013-07-02 NOTE — ED Provider Notes (Signed)
CT and f/u CT  Wynetta Emeryicole Tersea Aulds, PA-C 07/02/13 1621

## 2013-07-02 NOTE — ED Provider Notes (Signed)
CSN: [id number]     Arrival date & time 07/02/13  1354 History   First MD Initiated Contact with Patient 07/02/13 1423     Chief Complaint  Patient presents with  . Hernia   HPI  Chad Black is a 28 y.o. male with a PMH of testicular torsion, GERD and MRSA who presents to the ED for evaluation of a hernia. History was provided by the patient. Patient states that last night he was lifting a crate of water when he felt a pull in his periumbilical region with radiation into his groin bilaterally (L>R). His pain is constant and described as a sharp pain, which is worse with movement. Nothing improves his pain. Did not take anything for pain PTA. He has a known periumbilical hernia but has not had the insurance to have it fixed. Pain similar to his periumbilical hernia pain in the past. Has had this reduced in the ED in the past. He denies any nausea, emesis, diarrhea, dysuria, testicular pain, fever, chills. Has had constipation. 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    Past Medical History  Diagnosis Date  . Testicular torsion   . MRSA (methicillin resistant Staphylococcus aureus)   . GERD (gastroesophageal reflux disease)    Past Surgical History  Procedure Laterality Date  . Testicle surgery     Family History  Problem Relation Age of Onset  . Heart attack Father 4959    Died   History  Substance Use Topics  . Smoking status: Current Every Day Smoker -- 0.50 packs/day    Types: Cigarettes  . Smokeless tobacco: Current User  . Alcohol Use: Yes     Comment: occasionally    Review of Systems  Constitutional: Negative for fever, chills, activity change, appetite change and fatigue.  HENT: Negative for congestion and sore throat.   Cardiovascular: Negative for chest pain.  Gastrointestinal: Positive for abdominal pain and constipation. Negative for nausea, vomiting, diarrhea, blood in stool and anal bleeding.  Genitourinary: Negative for  dysuria, urgency, frequency, hematuria, decreased urine volume, discharge, scrotal swelling, difficulty urinating, penile pain and testicular pain.  Musculoskeletal: Negative for back pain and myalgias.  Neurological: Negative for dizziness, weakness, light-headedness and headaches.    Allergies  Morphine and related and Sulfa antibiotics  Home Medications   Prior to Admission medications   Medication Sig Start Date End Date Taking? Authorizing Provider  traMADol (ULTRAM) 50 MG tablet Take 1 tablet (50 mg total) by mouth every 6 (six) hours as needed for pain. 08/18/12   Raeford RazorStephen Kohut, MD   BP 131/63  Pulse 68  Temp(Src) 98.8 F (37.1 C) (Oral)  Resp 16  SpO2 98%  Filed Vitals:   07/02/13 1407 07/02/13 1832 07/02/13 2040  BP: 131/63 131/78 90/66  Pulse: 68 65 52  Temp: 98.8 F (37.1 C)    TempSrc: Oral    Resp: 16 19 16   SpO2: 98% 99% 100%    Physical Exam  Nursing note and vitals reviewed. Constitutional: He is oriented to person, place, and time. He appears well-developed and well-nourished. No distress.  HENT:  Head: Normocephalic and atraumatic.  Right Ear: External ear normal.  Left Ear: External ear normal.  Nose: Nose normal.  Mouth/Throat: Oropharynx is clear and moist.  Eyes: Conjunctivae are normal. Right eye exhibits no discharge. Left eye exhibits no discharge.  Neck: Normal range of motion. Neck supple.  Cardiovascular: Normal rate, regular rhythm and normal heart  sounds.  Exam reveals no gallop and no friction rub.   No murmur heard. Pulmonary/Chest: Effort normal and breath sounds normal. No respiratory distress. He has no wheezes. He has no rales. He exhibits no tenderness.  Abdominal: Soft. Bowel sounds are normal. He exhibits mass. He exhibits no distension. There is tenderness. There is no rebound and no guarding.  2 cm circular periumbilical hernia which is tender to palpation. Mild diffuse tenderness to the periumbilical region  Genitourinary:  No  testicular tenderness bilaterally. No scrotal edema or erythema. No inguinal hernia bilaterally. No genital sores.   Musculoskeletal: Normal range of motion. He exhibits no edema and no tenderness.  No CVA, lumbar, or flank tenderness bilaterally.   Neurological: He is alert and oriented to person, place, and time.  Skin: Skin is warm and dry. He is not diaphoretic.     ED Course  Procedures (including critical care time) Labs Review Labs Reviewed - No data to display  Imaging Review No results found.   EKG Interpretation None      Results for orders placed during the hospital encounter of 07/02/13  CBC WITH DIFFERENTIAL      Result Value Ref Range   WBC 6.4  4.0 - 10.5 K/uL   RBC 4.38  4.22 - 5.81 MIL/uL   Hemoglobin 12.7 (*) 13.0 - 17.0 g/dL   HCT 62.138.7 (*) 30.839.0 - 65.752.0 %   MCV 88.4  78.0 - 100.0 fL   MCH 29.0  26.0 - 34.0 pg   MCHC 32.8  30.0 - 36.0 g/dL   RDW 84.613.7  96.211.5 - 95.215.5 %   Platelets 148 (*) 150 - 400 K/uL   Neutrophils Relative % 61  43 - 77 %   Neutro Abs 3.9  1.7 - 7.7 K/uL   Lymphocytes Relative 29  12 - 46 %   Lymphs Abs 1.8  0.7 - 4.0 K/uL   Monocytes Relative 8  3 - 12 %   Monocytes Absolute 0.5  0.1 - 1.0 K/uL   Eosinophils Relative 2  0 - 5 %   Eosinophils Absolute 0.2  0.0 - 0.7 K/uL   Basophils Relative 0  0 - 1 %   Basophils Absolute 0.0  0.0 - 0.1 K/uL  COMPREHENSIVE METABOLIC PANEL      Result Value Ref Range   Sodium 141  137 - 147 mEq/L   Potassium 4.0  3.7 - 5.3 mEq/L   Chloride 104  96 - 112 mEq/L   CO2 25  19 - 32 mEq/L   Glucose, Bld 83  70 - 99 mg/dL   BUN 16  6 - 23 mg/dL   Creatinine, Ser 8.410.91  0.50 - 1.35 mg/dL   Calcium 9.1  8.4 - 32.410.5 mg/dL   Total Protein 6.5  6.0 - 8.3 g/dL   Albumin 3.9  3.5 - 5.2 g/dL   AST 16  0 - 37 U/L   ALT 13  0 - 53 U/L   Alkaline Phosphatase 53  39 - 117 U/L   Total Bilirubin <0.2 (*) 0.3 - 1.2 mg/dL   GFR calc non Af Amer >90  >90 mL/min   GFR calc Af Amer >90  >90 mL/min  URINALYSIS, ROUTINE  W REFLEX MICROSCOPIC      Result Value Ref Range   Color, Urine YELLOW  YELLOW   APPearance CLEAR  CLEAR   Specific Gravity, Urine 1.018  1.005 - 1.030   pH 7.0  5.0 - 8.0  Glucose, UA NEGATIVE  NEGATIVE mg/dL   Hgb urine dipstick NEGATIVE  NEGATIVE   Bilirubin Urine NEGATIVE  NEGATIVE   Ketones, ur NEGATIVE  NEGATIVE mg/dL   Protein, ur NEGATIVE  NEGATIVE mg/dL   Urobilinogen, UA 0.2  0.0 - 1.0 mg/dL   Nitrite NEGATIVE  NEGATIVE   Leukocytes, UA NEGATIVE  NEGATIVE     MDM   KOICHI PLATTE is a 28 y.o. male with a PMH of testicular torsion, GERD and MRSA who presents to the ED for evaluation of a hernia. Patient has a known previous periumbilical hernia which was unable to be successfully reduced in the ED. General surgery consulted and CT abdomen pelvis ordered.   Rechecks  3:20 PM = Unable to successfully reduce hernia at bedside with Dr. Anitra Lauth. Calling general surgery and obtaining CT     4:30 PM = Signed out care to [facility] PA-C to await results of CT scan. Likely re-page general surgery with results.     Luiz Iron PA-C   This patient was discussed with Dr. Mingo Amber, PA-C 07/02/13 2239

## 2013-07-02 NOTE — ED Notes (Signed)
Pt had hernia reduced 7 months ago.  Pt was lifting heavy object last night and felt pull to abdominal wall.  Pain shooting straight down testicle to left side.

## 2013-07-02 NOTE — ED Notes (Signed)
Assumed care of patient Per day shift report, patient initially asking for additional pain medication prior to proceeding with US testing, NP was made aware but no new orders placed US tech currently still at bedside This nurse attempted to find NP, NP currently in another patient's room--note left informing NP of patient's request US tech states that she will proceed with testing as ordered--patient OK with proceeding

## 2013-07-02 NOTE — ED Provider Notes (Signed)
Medical screening examination/treatment/procedure(s) were performed by non-physician practitioner and as supervising physician I was immediately available for consultation/collaboration.   EKG Interpretation None        Layla MawKristen N Ward, DO 07/02/13 2347

## 2013-07-03 NOTE — ED Provider Notes (Signed)
Medical screening examination/treatment/procedure(s) were performed by non-physician practitioner and as supervising physician I was immediately available for consultation/collaboration.   EKG Interpretation None        Gwyneth SproutWhitney Falisa Lamora, MD 07/03/13 [id number]

## 2014-01-01 ENCOUNTER — Emergency Department (HOSPITAL_COMMUNITY)
Admission: EM | Admit: 2014-01-01 | Discharge: 2014-01-01 | Disposition: A | Discharge status: Home / Self Care | Payer: Medicaid Other | Attending: Emergency Medicine | Admitting: Emergency Medicine

## 2014-01-01 ENCOUNTER — Encounter (HOSPITAL_COMMUNITY): Payer: Self-pay | Admitting: *Deleted

## 2014-01-01 ENCOUNTER — Emergency Department (HOSPITAL_COMMUNITY): Payer: Medicaid Other

## 2014-01-01 DIAGNOSIS — T1490XA Injury, unspecified, initial encounter: Secondary | ICD-10-CM

## 2014-01-01 DIAGNOSIS — W228XXA Striking against or struck by other objects, initial encounter: Secondary | ICD-10-CM | POA: Diagnosis not present

## 2014-01-01 DIAGNOSIS — Z8614 Personal history of Methicillin resistant Staphylococcus aureus infection: Secondary | ICD-10-CM | POA: Insufficient documentation

## 2014-01-01 DIAGNOSIS — Y9389 Activity, other specified: Secondary | ICD-10-CM | POA: Insufficient documentation

## 2014-01-01 DIAGNOSIS — Y998 Other external cause status: Secondary | ICD-10-CM | POA: Insufficient documentation

## 2014-01-01 DIAGNOSIS — Z720 Tobacco use: Secondary | ICD-10-CM | POA: Diagnosis not present

## 2014-01-01 DIAGNOSIS — Z8719 Personal history of other diseases of the digestive system: Secondary | ICD-10-CM | POA: Diagnosis not present

## 2014-01-01 DIAGNOSIS — M20011 Mallet finger of right finger(s): Secondary | ICD-10-CM | POA: Insufficient documentation

## 2014-01-01 DIAGNOSIS — Y929 Unspecified place or not applicable: Secondary | ICD-10-CM | POA: Diagnosis not present

## 2014-01-01 DIAGNOSIS — S6991XA Unspecified injury of right wrist, hand and finger(s), initial encounter: Secondary | ICD-10-CM | POA: Diagnosis present

## 2014-01-01 MED ORDER — HYDROCODONE-ACETAMINOPHEN 5-325 MG PO TABS: 1.0000 | ORAL_TABLET | ORAL | Status: DC | PRN

## 2014-01-01 NOTE — ED Notes (Signed)
Pt verbalized understanding of no driving and to use caution within 4 hours of taking pain meds due to meds cause drowsiness 

## 2014-01-01 NOTE — Discharge Instructions (Signed)
Mallet Finger °A mallet, or jammed, finger occurs when the end of a straightened finger or thumb receives a blow (often from a ball). This causes a disruption (tearing) of the extensor tendon (cord like structure which attaches muscle to bone) that straightens the end of your finger. The last joint in your finger will droop and you cannot extend it. Sometimes this is associated with a small fracture (break in bone) of the base of the end bone (phalanx) in your finger. It usually takes 4 to 5 weeks to heal. °HOME CARE INSTRUCTIONS  °· Apply ice to the sore finger for 15-20 minutes, 03-04 times per day for 2 days. Put the ice in a plastic bag and place a towel between the bag of ice and your skin. °· If you have a finger splint, wear your splint as directed. °¨ You may remove the splint to wash your finger or as directed. °¨ If your splint is off, do not try to bend the tip of your finger. °¨ Put your splint back on as soon as possible. If your finger is numb or tingling, the splint is probably too tight. You can loosen it so it is comfortable. °· Move the part of your injured finger that is not covered by the splint several times a day. °· Take medications as directed by your caregiver. Only take over-the-counter or prescription medicines for pain, discomfort, or fever as directed by your caregiver. °· IMPORTANT: follow up with your caregiver or keep or call for any appointments with specialists as directed. The failure to follow up could result in chronic pain and / or disability. °SEEK MEDICAL CARE IF:  °· You have increased pain or swelling. °· You notice coldness of your finger. °· After treatment you still cannot extend your finger. °SEEK IMMEDIATE MEDICAL CARE IF:  °Your finger is swollen and very red, white, blue, numb, cold, or tingling. °MAKE SURE YOU:  °· Understand these instructions. °· Will watch your condition. °· Will get help right away if you are not doing well or get worse. °Document Released:  01/02/2000 Document Revised: 05/21/2013 Document Reviewed: 08/18/2007 °Exit[employer]® Patient Information ©2015 [employer]. This information is not intended to replace advice given to you by your health care provider. Make sure you discuss any questions you have with your health care provider. ° °

## 2014-01-01 NOTE — ED Notes (Signed)
Pain, rt little finger.  .  Wants a new splint, and says he cannot bend his finger.  [facility]

## 2014-01-02 NOTE — ED Provider Notes (Signed)
CSN: [id number]     Arrival date & time 01/01/14  1144 History   First MD Initiated Contact with Patient 01/01/14 1433     Chief Complaint  Patient presents with  . Hand Injury     (Consider location/radiation/quality/duration/timing/severity/associated sxs/prior Treatment) The history is provided by the patient.   Chad Black is a 28 y.o. male presenting with complaint of persistent pain, swelling and inability to extend his right fifth distal finger since a trauma which occurred 2 weeks ago.  He jammed his finger against an appliance at work when the injury first occurred.  [facility] at which time he was diagnosed with a probable tendon injury and mallet finger.  He was referred to a hand specialist but given his insurance class had to see his PCP for the specialist referral.  He was just seen today for this referral, unfortunately he is now waiting for a callback from the orthopedist to schedule an appointment time for further management of this injury.  In the interim he has persistent pain which is not well controlled with ibuprofen and he is also desirous of replacement splint as the original splint is falling apart.  He denies new injury.  There is no radiation of pain.   Past Medical History  Diagnosis Date  . Testicular torsion   . MRSA (methicillin resistant Staphylococcus aureus)   . GERD (gastroesophageal reflux disease)    Past Surgical History  Procedure Laterality Date  . Testicle surgery    . Hernia repair     Family History  Problem Relation Age of Onset  . Heart attack Father 6659    Died   History  Substance Use Topics  . Smoking status: Current Every Day Smoker -- 0.50 packs/day    Types: Cigarettes  . Smokeless tobacco: Current User  . Alcohol Use: Yes     Comment: occasionally    Review of Systems  Constitutional: Negative for fever.  Musculoskeletal: Positive for joint swelling and arthralgias. Negative for myalgias.    Neurological: Negative for weakness and numbness.      Allergies  Morphine and related; Codeine; and Sulfa antibiotics  Home Medications   Prior to Admission medications   Medication Sig Start Date End Date Taking? Authorizing Provider  HYDROcodone-acetaminophen (NORCO/VICODIN) 5-325 MG per tablet Take 1 tablet by mouth every 4 (four) hours as needed. 01/01/14   Burgess AmorJulie Stacey Maura, PA-C  ibuprofen (ADVIL,MOTRIN) 200 MG tablet Take 400 mg by mouth every 6 (six) hours as needed for moderate pain.    Historical Provider, MD   BP 112/66 mmHg  Pulse 60  Temp(Src) 98.1 F (36.7 C) (Oral)  Resp 18  Ht 6\' 1"  (1.854 m)  Wt 195 lb (88.451 kg)  BMI 25.73 kg/m2  SpO2 100% Physical Exam  Constitutional: He appears well-developed and well-nourished.  HENT:  Head: Atraumatic.  Neck: Normal range of motion.  Cardiovascular:  Pulses equal bilaterally  Musculoskeletal: He exhibits tenderness.       Right hand: He exhibits bony tenderness. Normal sensation noted.       Hands: Tender to palpation at right fifth DIP joint.  Patient is unable to extend the distal phalanx both resisted and against gravity.  No obvious deformity noted.  Cap refill in his digits is less than 2 seconds.  Sensation intact.  Neurological: He is alert. He has normal strength. He displays normal reflexes. No sensory deficit.  Skin: Skin is warm and dry.  Psychiatric: He has  a normal mood and affect.    ED Course  Procedures (including critical care time) Labs Review Labs Reviewed - No data to display  Imaging Review Dg Finger Little Right  01/01/2014   CLINICAL DATA:  Hit fifth digit 2 weeks ago.  Initial evaluation.  EXAM: RIGHT LITTLE FINGER 2+V  COMPARISON:  None.  FINDINGS: Soft tissue swelling. No evidence of fracture or dislocation. No radiopaque foreign body.  IMPRESSION: No acute bony or joint abnormality.   Electronically Signed   By: Maisie Fushomas  Register   On: 01/01/2014 13:07     EKG Interpretation None       MDM   Final diagnoses:  Mallet deformity of fifth finger of right hand    New splint applied.  Hydrocodone, continue taking ibuprofen.  F/u with hand specialist as is being arranged by pcp.  Patients labs and/or radiological studies were viewed and considered during the medical decision making and disposition process.     Burgess AmorJulie Wisam Siefring, PA-C 01/02/14 1808  Vida RollerBrian D Miller, MD 01/03/14 [id number]

## 2014-11-19 ENCOUNTER — Emergency Department (HOSPITAL_COMMUNITY)
Admission: EM | Admit: 2014-11-19 | Discharge: 2014-11-19 | Disposition: A | Discharge status: Home / Self Care | Payer: Medicaid Other | Attending: Emergency Medicine | Admitting: Emergency Medicine

## 2014-11-19 ENCOUNTER — Encounter (HOSPITAL_COMMUNITY): Payer: Self-pay | Admitting: Emergency Medicine

## 2014-11-19 DIAGNOSIS — Z8614 Personal history of Methicillin resistant Staphylococcus aureus infection: Secondary | ICD-10-CM | POA: Insufficient documentation

## 2014-11-19 DIAGNOSIS — Z720 Tobacco use: Secondary | ICD-10-CM | POA: Insufficient documentation

## 2014-11-19 DIAGNOSIS — Z87448 Personal history of other diseases of urinary system: Secondary | ICD-10-CM | POA: Insufficient documentation

## 2014-11-19 DIAGNOSIS — R21 Rash and other nonspecific skin eruption: Secondary | ICD-10-CM | POA: Diagnosis present

## 2014-11-19 DIAGNOSIS — B029 Zoster without complications: Secondary | ICD-10-CM | POA: Diagnosis not present

## 2014-11-19 DIAGNOSIS — Z8719 Personal history of other diseases of the digestive system: Secondary | ICD-10-CM | POA: Diagnosis not present

## 2014-11-19 MED ORDER — VALACYCLOVIR HCL 500 MG PO TABS
500.0000 mg | ORAL_TABLET | Freq: Once | ORAL | Status: AC
  Administered 2014-11-19: 500 mg via ORAL
  Filled 2014-11-19: qty 1

## 2014-11-19 MED ORDER — OXYCODONE-ACETAMINOPHEN 5-325 MG PO TABS: 1.0000 | ORAL_TABLET | Freq: Three times a day (TID) | ORAL | Status: DC | PRN

## 2014-11-19 MED ORDER — OXYCODONE-ACETAMINOPHEN 5-325 MG PO TABS
1.0000 | ORAL_TABLET | Freq: Once | ORAL | Status: AC
  Administered 2014-11-19: 1 via ORAL
  Filled 2014-11-19: qty 1

## 2014-11-19 MED ORDER — VALACYCLOVIR HCL 500 MG PO TABS: 500.0000 mg | ORAL_TABLET | Freq: Three times a day (TID) | ORAL | Status: AC

## 2014-11-19 NOTE — ED Provider Notes (Signed)
CSN: [id number]     Arrival date & time 11/19/14  1323 History   First MD Initiated Contact with Patient 11/19/14 1338     Chief Complaint  Patient presents with  . Rash     (Consider location/radiation/quality/duration/timing/severity/associated sxs/prior Treatment) Patient is a 29 y.o. male presenting with rash.  Rash Location:  Shoulder/arm Shoulder/arm rash location:  R forearm and R upper arm Quality: painful and redness   Pain details:    Quality:  Sharp and stinging   Severity:  Severe   Onset quality:  Gradual   Duration:  3 days   Timing:  Constant   Progression:  Worsening Severity:  Severe Onset quality:  Gradual Timing:  Constant Progression:  Worsening Chronicity:  New Relieved by:  None tried Worsened by:  Nothing tried Ineffective treatments:  None tried Associated symptoms: no abdominal pain, no diarrhea, no fever, no headaches, no joint pain, no shortness of breath and not vomiting     Past Medical History  Diagnosis Date  . Testicular torsion   . MRSA (methicillin resistant Staphylococcus aureus)   . GERD (gastroesophageal reflux disease)    Past Surgical History  Procedure Laterality Date  . Testicle surgery    . Hernia repair     Family History  Problem Relation Age of Onset  . Heart attack Father 39    Died   Social History  Substance Use Topics  . Smoking status: Current Every Day Smoker -- 0.50 packs/day    Types: Cigarettes  . Smokeless tobacco: Current User  . Alcohol Use: No    Review of Systems  Constitutional: Negative for fever, chills and activity change.  HENT: Negative for congestion and rhinorrhea.   Eyes: Negative for visual disturbance.  Respiratory: Negative for cough and shortness of breath.   Cardiovascular: Negative for chest pain.  Gastrointestinal: Negative for vomiting, abdominal pain, diarrhea and constipation.  Endocrine: Negative for polyuria.  Genitourinary: Negative for dysuria and flank pain.   Musculoskeletal: Negative for back pain, arthralgias and neck pain.  Skin: Positive for rash. Negative for wound.  Neurological: Negative for headaches.  All other systems reviewed and are negative.     Allergies  Morphine and related; Codeine; and Sulfa antibiotics  Home Medications   Prior to Admission medications   Medication Sig Start Date End Date Taking? Authorizing Provider  ibuprofen (ADVIL,MOTRIN) 200 MG tablet Take 400 mg by mouth every 6 (six) hours as needed for moderate pain.   Yes Historical Provider, MD  oxyCODONE-acetaminophen (PERCOCET/ROXICET) 5-325 MG tablet Take 1 tablet by mouth every 8 (eight) hours as needed for severe pain. 11/19/14   Marily Memos, MD  valACYclovir (VALTREX) 500 MG tablet Take 1 tablet (500 mg total) by mouth 3 (three) times daily. 11/19/14   Marily Memos, MD   BP 122/54 mmHg  Pulse 83  Temp(Src) 98.2 F (36.8 C) (Oral)  Resp 16  Ht  (1.854 m)  Wt 180 lb (81.647 kg)  BMI 23.75 kg/m2  SpO2 100% Physical Exam  Constitutional: He appears well-developed and well-nourished.  HENT:  Head: Normocephalic and atraumatic.  Neck: Normal range of motion.  Cardiovascular: Normal rate.   Pulmonary/Chest: Effort normal. No respiratory distress.  Abdominal: He exhibits no distension.  Musculoskeletal: Normal range of motion.  Neurological: He is alert.  Skin: Rash (right forearm, elbow and right upper arm, vesicular on red base) noted.  Nursing note and vitals reviewed.   ED Course  Procedures (including critical care time) Labs Review  Labs Reviewed - No data to display  Imaging Review No results found. I have personally reviewed and evaluated these images and lab results as part of my medical decision-making.   EKG Interpretation None      MDM   Final diagnoses:  Shingles   Rash consistent with shingles. Still worsening, will start on antivirals and pain meds. He will FU w/ pcp for further management of post herpetic pain as  needed.   I have personally and contemperaneously reviewed labs and imaging and used in my decision making as above.   A medical screening exam was performed and I feel the patient has had an appropriate workup for their chief complaint at this time and likelihood of emergent condition existing is low. They have been counseled on decision, discharge, follow up and which symptoms necessitate immediate return to the emergency department. They or their family verbally stated understanding and agreement with plan and discharged in stable condition.      Marily MemosJason Reianna Batdorf, MD 11/19/14 [phone]

## 2014-11-19 NOTE — ED Notes (Signed)
Pt reports rash noted to right forearm,right posterior upper arm. Pt reports site tender to touch and denies itching. pt reports intense pain.

## 2014-11-19 NOTE — ED Notes (Signed)
MD at bedside. 

## 2014-12-04 ENCOUNTER — Emergency Department (HOSPITAL_COMMUNITY): Payer: Medicaid Other

## 2014-12-04 ENCOUNTER — Emergency Department (HOSPITAL_COMMUNITY)
Admission: EM | Admit: 2014-12-04 | Discharge: 2014-12-04 | Disposition: A | Discharge status: Home / Self Care | Payer: Medicaid Other | Attending: Emergency Medicine | Admitting: Emergency Medicine

## 2014-12-04 ENCOUNTER — Encounter (HOSPITAL_COMMUNITY): Payer: Self-pay | Admitting: Emergency Medicine

## 2014-12-04 DIAGNOSIS — Y9389 Activity, other specified: Secondary | ICD-10-CM | POA: Insufficient documentation

## 2014-12-04 DIAGNOSIS — Y9289 Other specified places as the place of occurrence of the external cause: Secondary | ICD-10-CM | POA: Diagnosis not present

## 2014-12-04 DIAGNOSIS — F17210 Nicotine dependence, cigarettes, uncomplicated: Secondary | ICD-10-CM | POA: Insufficient documentation

## 2014-12-04 DIAGNOSIS — Z8614 Personal history of Methicillin resistant Staphylococcus aureus infection: Secondary | ICD-10-CM | POA: Diagnosis not present

## 2014-12-04 DIAGNOSIS — W260XXA Contact with knife, initial encounter: Secondary | ICD-10-CM | POA: Diagnosis not present

## 2014-12-04 DIAGNOSIS — Z79899 Other long term (current) drug therapy: Secondary | ICD-10-CM | POA: Insufficient documentation

## 2014-12-04 DIAGNOSIS — Z87448 Personal history of other diseases of urinary system: Secondary | ICD-10-CM | POA: Insufficient documentation

## 2014-12-04 DIAGNOSIS — Y998 Other external cause status: Secondary | ICD-10-CM | POA: Diagnosis not present

## 2014-12-04 DIAGNOSIS — S6992XA Unspecified injury of left wrist, hand and finger(s), initial encounter: Secondary | ICD-10-CM | POA: Diagnosis present

## 2014-12-04 DIAGNOSIS — Z8719 Personal history of other diseases of the digestive system: Secondary | ICD-10-CM | POA: Diagnosis not present

## 2014-12-04 DIAGNOSIS — S61211A Laceration without foreign body of left index finger without damage to nail, initial encounter: Secondary | ICD-10-CM | POA: Insufficient documentation

## 2014-12-04 MED ORDER — LIDOCAINE HCL (PF) 1 % IJ SOLN
INTRAMUSCULAR | Status: AC
  Filled 2014-12-04: qty 10

## 2014-12-04 MED ORDER — CEPHALEXIN 500 MG PO CAPS: 500.0000 mg | ORAL_CAPSULE | Freq: Three times a day (TID) | ORAL | Status: AC

## 2014-12-04 NOTE — ED Notes (Signed)
Laceration sutured by Dr. Patria Maneampos

## 2014-12-04 NOTE — ED Notes (Signed)
Pt has laceration to left index finger. Bleeding controlled

## 2014-12-04 NOTE — ED Notes (Signed)
Pt declined xray

## 2014-12-04 NOTE — Discharge Instructions (Signed)
Laceration Care, Adult  A laceration is a cut that goes through all layers of the skin. The cut also goes into the tissue that is right under the skin. Some cuts heal on their own. Others need to be closed with stitches (sutures), staples, skin adhesive strips, or wound glue. Taking care of your cut lowers your risk of infection and helps your cut to heal better.  HOW TO TAKE CARE OF YOUR CUT  For stitches or staples:  · Keep the wound clean and dry.  · If you were given a bandage (dressing), you should change it at least one time per day or as told by your doctor. You should also change it if it gets wet or dirty.  · Keep the wound completely dry for the first 24 hours or as told by your doctor. After that time, you may take a shower or a bath. However, make sure that the wound is not soaked in water until after the stitches or staples have been removed.  · Clean the wound one time each day or as told by your doctor:    Wash the wound with soap and water.    Rinse the wound with water until all of the soap comes off.    Pat the wound dry with a clean towel. Do not rub the wound.  · After you clean the wound, put a thin layer of antibiotic ointment on it as told by your doctor. This ointment:    Helps to prevent infection.    Keeps the bandage from sticking to the wound.  · Have your stitches or staples removed as told by your doctor.  If your doctor used skin adhesive strips:   · Keep the wound clean and dry.  · If you were given a bandage, you should change it at least one time per day or as told by your doctor. You should also change it if it gets dirty or wet.  · Do not get the skin adhesive strips wet. You can take a shower or a bath, but be careful to keep the wound dry.  · If the wound gets wet, pat it dry with a clean towel. Do not rub the wound.  · Skin adhesive strips fall off on their own. You can trim the strips as the wound heals. Do not remove any strips that are still stuck to the wound. They will  fall off after a while.  If your doctor used wound glue:  · Try to keep your wound dry, but you may briefly wet it in the shower or bath. Do not soak the wound in water, such as by swimming.  · After you take a shower or a bath, gently pat the wound dry with a clean towel. Do not rub the wound.  · Do not do any activities that will make you really sweaty until the skin glue has fallen off on its own.  · Do not apply liquid, cream, or ointment medicine to your wound while the skin glue is still on.  · If you were given a bandage, you should change it at least one time per day or as told by your doctor. You should also change it if it gets dirty or wet.  · If a bandage is placed over the wound, do not let the tape for the bandage touch the skin glue.  · Do not pick at the glue. The skin glue usually stays on for 5-10 days. Then, it   falls off of the skin.  General Instructions   · To help prevent scarring, make sure to cover your wound with sunscreen whenever you are outside after stitches are removed, after adhesive strips are removed, or when wound glue stays in place and the wound is healed. Make sure to wear a sunscreen of at least 30 SPF.  · Take over-the-counter and prescription medicines only as told by your doctor.  · If you were given antibiotic medicine or ointment, take or apply it as told by your doctor. Do not stop using the antibiotic even if your wound is getting better.  · Do not scratch or pick at the wound.  · Keep all follow-up visits as told by your doctor. This is important.  · Check your wound every day for signs of infection. Watch for:    Redness, swelling, or pain.    Fluid, blood, or pus.  · Raise (elevate) the injured area above the level of your heart while you are sitting or lying down, if possible.  GET HELP IF:  · You got a tetanus shot and you have any of these problems at the injection site:    Swelling.    Very bad pain.    Redness.    Bleeding.  · You have a fever.  · A wound that was  closed breaks open.  · You notice a bad smell coming from your wound or your bandage.  · You notice something coming out of the wound, such as wood or glass.  · Medicine does not help your pain.  · You have more redness, swelling, or pain at the site of your wound.  · You have fluid, blood, or pus coming from your wound.  · You notice a change in the color of your skin near your wound.  · You need to change the bandage often because fluid, blood, or pus is coming from the wound.  · You start to have a new rash.  · You start to have numbness around the wound.  GET HELP RIGHT AWAY IF:  · You have very bad swelling around the wound.  · Your pain suddenly gets worse and is very bad.  · You notice painful lumps near the wound or on skin that is anywhere on your body.  · You have a red streak going away from your wound.  · The wound is on your hand or foot and you cannot move a finger or toe like you usually can.  · The wound is on your hand or foot and you notice that your fingers or toes look pale or bluish.     This information is not intended to replace advice given to you by your health care provider. Make sure you discuss any questions you have with your health care provider.     Document Released: 06/23/2007 Document Revised: 05/21/2014 Document Reviewed: 12/31/2013  [employer] Interactive Patient Education ©2016 [employer]

## 2014-12-04 NOTE — ED Notes (Signed)
Patient states that he cut his finger with a skinning knife and cut his left index finger. States he saw the bone and the blood was squirting out at first. Bleeding is under control.

## 2014-12-06 NOTE — ED Provider Notes (Signed)
CSN: [id number]     Arrival date & time 12/04/14  1420 History   First MD Initiated Contact with Patient 12/04/14 1443     Chief Complaint  Patient presents with  . Extremity Laceration      HPI Patient presents to the emergency department with a laceration to his left index finger on the dorsal surface.  He can flex and extend that finger without any difficulty.  Bleeding is now under control.  He is unsure of his last tetanus shot.  He requests medication for pain.  He denies numbness or tingling.  No other issues.   Past Medical History  Diagnosis Date  . Testicular torsion   . MRSA (methicillin resistant Staphylococcus aureus)   . GERD (gastroesophageal reflux disease)    Past Surgical History  Procedure Laterality Date  . Testicle surgery    . Hernia repair     Family History  Problem Relation Age of Onset  . Heart attack Father [id number]    Died   Social History  Substance Use Topics  . Smoking status: Current Every Day Smoker -- 0.50 packs/day    Types: Cigarettes  . Smokeless tobacco: Current User  . Alcohol Use: No    Review of Systems  All other systems reviewed and are negative.     Allergies  Morphine and related; Codeine; and Sulfa antibiotics  Home Medications   Prior to Admission medications   Medication Sig Start Date End Date Taking? Authorizing Provider  ibuprofen (ADVIL,MOTRIN) 200 MG tablet Take 400 mg by mouth every 6 (six) hours as needed for moderate pain.   Yes Historical Provider, MD  valACYclovir (VALTREX) 500 MG tablet Take 1 tablet (500 mg total) by mouth 3 (three) times daily. 11/19/14  Yes Marily MemosJason Mesner, MD  cephALEXin (KEFLEX) 500 MG capsule Take 1 capsule (500 mg total) by mouth 3 (three) times daily. 12/04/14   Azalia BilisKevin Javi Bollman, MD   BP 127/74 mmHg  Pulse 81  Temp(Src) 98 F (36.7 C) (Oral)  Resp 18  Ht 6\' 1"  (1.854 m)  Wt 180 lb (81.647 kg)  BMI 23.75 kg/m2  SpO2 100% Physical Exam  Constitutional: He is oriented to person, place,  and time. He appears well-developed and well-nourished.  HENT:  Head: Normocephalic.  Eyes: EOM are normal.  Neck: Normal range of motion.  Pulmonary/Chest: Effort normal.  Abdominal: He exhibits no distension.  Musculoskeletal: Normal range of motion.  Laceration overlying the proximal phalanx of the left index finger slightly on the radial surface.  Normal perfusion of the left index finger distally.  Patient able to flex and extend the left index finger without difficulty at both the PIP, DIP, MCP.  No active bleeding.  Finger taken through full range of motion and unable to visualize any tendon injury.  Neurological: He is alert and oriented to person, place, and time.  Psychiatric: He has a normal mood and affect.  Nursing note and vitals reviewed.   ED Course  Procedures (including critical care time)  LACERATION REPAIR Performed by: Lyanne CoAMPOS,Luwanda Starr M Consent: Verbal consent obtained. Risks and benefits: risks, benefits and alternatives were discussed Patient identity confirmed: provided demographic data Time out performed prior to procedure Prepped and Draped in normal sterile fashion Wound explored Laceration Location: Left index finger Laceration Length: 1.5 cm No Foreign Bodies seen or palpated Anesthesia: local infiltration Local anesthetic: lidocaine 2 % with epinephrine Anesthetic total: 3 ml Irrigation method: syringe Amount of cleaning: Extensive  Skin closure: 4-0 Prolene  Number  of sutures or staples: 3  Technique: Simple interrupted  Patient tolerance: Patient tolerated the procedure well with no immediate complications.   Labs Review Labs Reviewed - No data to display  Imaging Review No results found. I have personally reviewed and evaluated these images and lab results as part of my medical decision-making.   EKG Interpretation None      MDM   Final diagnoses:  Laceration of left index finger w/o foreign body w/o damage to nail, initial encounter     Laceration repaired after extensive irrigation at the bedside sink after anesthetic provided.  Infection warnings given.  Patient be started on antibiotics.  Anti-inflammatories for pain.    Azalia Bilis, MD 12/06/14 [phone]

## 2014-12-11 ENCOUNTER — Encounter (HOSPITAL_COMMUNITY): Payer: Self-pay | Admitting: Emergency Medicine

## 2014-12-11 ENCOUNTER — Emergency Department (HOSPITAL_COMMUNITY)
Admission: EM | Admit: 2014-12-11 | Discharge: 2014-12-11 | Disposition: A | Discharge status: Home / Self Care | Payer: Medicaid Other | Attending: Emergency Medicine | Admitting: Emergency Medicine

## 2014-12-11 ENCOUNTER — Emergency Department (HOSPITAL_COMMUNITY): Payer: Medicaid Other

## 2014-12-11 DIAGNOSIS — Y998 Other external cause status: Secondary | ICD-10-CM | POA: Diagnosis not present

## 2014-12-11 DIAGNOSIS — Y9389 Activity, other specified: Secondary | ICD-10-CM | POA: Insufficient documentation

## 2014-12-11 DIAGNOSIS — F17210 Nicotine dependence, cigarettes, uncomplicated: Secondary | ICD-10-CM | POA: Insufficient documentation

## 2014-12-11 DIAGNOSIS — S99912A Unspecified injury of left ankle, initial encounter: Secondary | ICD-10-CM | POA: Diagnosis present

## 2014-12-11 DIAGNOSIS — Y9289 Other specified places as the place of occurrence of the external cause: Secondary | ICD-10-CM | POA: Diagnosis not present

## 2014-12-11 DIAGNOSIS — W1843XA Slipping, tripping and stumbling without falling due to stepping from one level to another, initial encounter: Secondary | ICD-10-CM | POA: Diagnosis not present

## 2014-12-11 DIAGNOSIS — Z8719 Personal history of other diseases of the digestive system: Secondary | ICD-10-CM | POA: Diagnosis not present

## 2014-12-11 DIAGNOSIS — Z792 Long term (current) use of antibiotics: Secondary | ICD-10-CM | POA: Diagnosis not present

## 2014-12-11 DIAGNOSIS — M25572 Pain in left ankle and joints of left foot: Secondary | ICD-10-CM

## 2014-12-11 DIAGNOSIS — Z87438 Personal history of other diseases of male genital organs: Secondary | ICD-10-CM | POA: Insufficient documentation

## 2014-12-11 DIAGNOSIS — Z8614 Personal history of Methicillin resistant Staphylococcus aureus infection: Secondary | ICD-10-CM | POA: Insufficient documentation

## 2014-12-11 MED ORDER — IBUPROFEN 800 MG PO TABS: 800.0000 mg | ORAL_TABLET | Freq: Three times a day (TID) | ORAL | Status: DC

## 2014-12-11 MED ORDER — IBUPROFEN 800 MG PO TABS
800.0000 mg | ORAL_TABLET | Freq: Once | ORAL | Status: AC
  Administered 2014-12-11: 800 mg via ORAL
  Filled 2014-12-11: qty 1

## 2014-12-11 NOTE — Discharge Instructions (Signed)
1. Medications: motrin, usual home medications 2. Treatment: rest, drink plenty of fluids, ice, elevate, wear ankle brace 3. Follow Up: please followup with your primary doctor for discussion of your diagnoses and further evaluation after today's visit; please follow-up with orthopedics for persistent or worsening symptoms; if you do not have a primary care doctor use the resource guide provided to find one; please return to the ER for numbness, weakness, new or worsening symptoms   Ankle Pain Ankle pain is a common symptom. The bones, cartilage, tendons, and muscles of the ankle joint perform a lot of work each day. The ankle joint holds your body weight and allows you to move around. Ankle pain can occur on either side or back of 1 or both ankles. Ankle pain may be sharp and burning or dull and aching. There may be tenderness, stiffness, redness, or warmth around the ankle. The pain occurs more often when a person walks or puts pressure on the ankle. CAUSES  There are many reasons ankle pain can develop. It is important to work with your caregiver to identify the cause since many conditions can impact the bones, cartilage, muscles, and tendons. Causes for ankle pain include:  Injury, including a break (fracture), sprain, or strain often due to a fall, sports, or a high-impact activity.  Swelling (inflammation) of a tendon (tendonitis).  Achilles tendon rupture.  Ankle instability after repeated sprains and strains.  Poor foot alignment.  Pressure on a nerve (tarsal tunnel syndrome).  Arthritis in the ankle or the lining of the ankle.  Crystal formation in the ankle (gout or pseudogout). DIAGNOSIS  A diagnosis is based on your medical history, your symptoms, results of your physical exam, and results of diagnostic tests. Diagnostic tests may include X-ray exams or a computerized magnetic scan (magnetic resonance imaging, MRI). TREATMENT  Treatment will depend on the cause of your ankle  pain and may include:  Keeping pressure off the ankle and limiting activities.  Using crutches or other walking support (a cane or brace).  Using rest, ice, compression, and elevation.  Participating in physical therapy or home exercises.  Wearing shoe inserts or special shoes.  Losing weight.  Taking medications to reduce pain or swelling or receiving an injection.  Undergoing surgery. HOME CARE INSTRUCTIONS   Only take over-the-counter or prescription medicines for pain, discomfort, or fever as directed by your caregiver.  Put ice on the injured area.  Put ice in a plastic bag.  Place a towel between your skin and the bag.  Leave the ice on for 15-20 minutes at a time, 03-04 times a day.  Keep your leg raised (elevated) when possible to lessen swelling.  Avoid activities that cause ankle pain.  Follow specific exercises as directed by your caregiver.  Record how often you have ankle pain, the location of the pain, and what it feels like. This information may be helpful to you and your caregiver.  Ask your caregiver about returning to work or sports and whether you should drive.  Follow up with your caregiver for further examination, therapy, or testing as directed. SEEK MEDICAL CARE IF:   Pain or swelling continues or worsens beyond 1 week.  You have an oral temperature above 102 F (38.9 C).  You are feeling unwell or have chills.  You are having an increasingly difficult time with walking.  You have loss of sensation or other new symptoms.  You have questions or concerns. MAKE SURE YOU:   Understand these instructions.  Will watch your condition.  Will get help right away if you are not doing well or get worse.   This information is not intended to replace advice given to you by your health care provider. Make sure you discuss any questions you have with your health care provider.   Document Released: 06/24/2009 Document Revised: 03/29/2011 Document  Reviewed: 08/06/2014 Elsevier Interactive Patient Education 2016 [employer].   Emergency Department Resource Guide 1) Find a Doctor and Pay Out of Pocket Although you won't have to find out who is covered by your insurance plan, it is a good idea to ask around and get recommendations. You will then need to call the office and see if the doctor you have chosen will accept you as a new patient and what types of options they offer for patients who are self-pay. Some doctors offer discounts or will set up payment plans for their patients who do not have insurance, but you will need to ask so you aren't surprised when you get to your appointment.  2) Contact Your Local Health Department Not all health departments have doctors that can see patients for sick visits, but many do, so it is worth a call to see if yours does. If you don't know where your local health department is, you can check in your phone book. The CDC also has a tool to help you locate your state's health department, and many state websites also have listings of all of their local health departments.  3) Find a Walk-in Clinic If your illness is not likely to be very severe or complicated, you may want to try a walk in clinic. These are popping up all over the country in pharmacies, drugstores, and shopping centers. They're usually staffed by nurse practitioners or physician assistants that have been trained to treat common illnesses and complaints. They're usually fairly quick and inexpensive. However, if you have serious medical issues or chronic medical problems, these are probably not your best option.  No Primary Care Doctor: - Call Health Connect at  [phone] - they can help you locate a primary care doctor that  accepts your insurance, provides certain services, etc. - Physician Referral Service- [phone]  Chronic Pain Problems: Organization         Address  Phone   Notes  [facility]  [phone]  Patients need to be referred by their primary care doctor.   Medication Assistance: Organization         Address  Phone   Notes  [facility] Medication [facility] [address] [place], [place] [address] [phone] --Must be a resident of [facility] -- Must have NO insurance coverage whatsoever (no Medicaid/ Medicare, etc.) -- The pt. MUST have a primary care doctor that directs their care regularly and follows them in the community   [facility]ist  [phone]   [employer]  [phone]    Agencies that provide inexpensive medical care: Organization         Address  Phone   Notes  [facility]  [phone]   [facility]    [phone]   [facility] [address] [place], [place] [address] [phone]   [facility] [address] [place], [place] [phone]   [facility]anned Parenthood    [phone]   [facility]    [phone]   [facility]  [address], [place] Phone:  [phone], Fax:  [phone] Hours of Operation:  9 am - 6 pm, M-F.  Also accepts Medicaid/Medicare and self-pay.  [facility]  [address], [place] Phone: [phone], Fax: [phone]. Hours of Operation:  8:30 am - 5:30 pm, M-F.  Also accepts Medicaid and self-pay.  [facility] [place] [address], [place] Phone: [phone]   [facility] [address] Natasha Bence [place], [place] [phone], Ext. [phone] Mondays & Thursdays: 7-9 AM.  First 15 patients are seen on a first come, first serve basis.    Medicaid-accepting [facility] Providers:  Organization         Address  Phone   Notes  [facility] [address], [place] [phone] Also accepts self-pay patients.  [facility] [address] Laurell Josephs [place], [place]  [phone]   [facility] [address], [place] [phone]   [facility] [address], [place] [phone]   Renaye Rakers [address], [place]   [phone] Only accepts [place] patients after they have their name applied to their card.   Self-Pay (no insurance) in [facility]:  Organization         Address  Phone   Notes  Sickle Cell Patients, [facility] Internal Medicine [address] [place], [place] [phone]   [facility] [address] [place], [place] [phone]   [facility]  [address], [place] [phone]   [facility]/Dr. Osei-Bonsu  [address] [place] or [address], [place] [phone] Phone number for both [place] and [place] locations is the same.  Urgent Medical and [facility] [address], [place] [phone]   [facility] [address], [place] or [address] [phone]   [facility] [address], [place] [phone], phone; [phone], fax Sees patients 1st and 3rd Saturday of every month.  Must not qualify for public or private insurance (i.e. Medicaid, Medicare, [place] Health Choice, Veterans' Benefits)  Household income should be no more than 200% of the poverty level The clinic cannot treat you if you are pregnant or think you are pregnant  Sexually transmitted diseases are not treated at the clinic.    Dental Care: Organization         Address  Phone  Notes  [facility] Department of [facility] [address] [place], [place] [phone] Accepts children up to age 60 who are enrolled in [place] or [place] Health Choice; pregnant women with a Medicaid card; and children who have applied for Medicaid or [place] Health Choice, but were  declined, whose parents can pay a reduced fee at time of service.  [facility] Department of [facility]  [address], [place] [phone] Accepts children up to age 62 who are enrolled in [place] or [place] Health Choice; pregnant women with a Medicaid card; and children who have applied for Medicaid or [place] Health Choice, but were declined, whose parents can pay a reduced fee at time of service.  [facility] Adult Dental Access PROGRAM  [address] [place], [place] [phone]  Patients are seen by appointment only. Walk-ins are not accepted. [facility] will see patients 29 years of age and older. Monday - Tuesday (8am-5pm) Most Wednesdays (8:30-5pm) $30 per visit, cash only  [facility]  [address], [facility] [phone] Patients are seen by appointment only. Walk-ins are not accepted. [facility] will see patients 29 years of age and older. One Wednesday Evening (Monthly: Volunteer Based).  $30 per visit, cash only  [facility]  [phone] for adults; Children under age 594, call Graduate Pediatric Dentistry at [phone]. Children aged 754-14, please call [phone] to request a pediatric application.  Dental services are provided in all areas of dental care including fillings, crowns and bridges, complete and partial dentures, implants, gum treatment, root canals, and extractions. Preventive care is also provided. Treatment is provided to both adults and children. Patients are selected via a lottery and there is often a waiting list.   [facility] [address], [place]  [phone] [email or web address]   [facility] [address], [place], [place] [phone], Ext. [phone] Second and Fourth Thursday of each month, opens at 6:30 AM; Clinic ends at 9 AM.  Patients are seen on a first-come first-served basis, and a limited number are seen during each clinic.    [facility]  [address] [place] [address], [place], [place] [phone]   Eligibility Requirements You must have lived in [place], [place], or [place] counties for at least the last three months.   You cannot be eligible for state or federal sponsored [place] Cityhealthcare insurance, including CIGNAVeterans Administration, [place]icaid, or [employer]icare.   You generally cannot be eligible for healthcare insurance through your employer.    How to apply: Eligibility screenings are held every Tuesday and Wednesday afternoon from 1:00 pm until 4:00 pm. You do not need an appointment for the interview!  [facility] [address], [place], [place] [phone]   [facility]  [phone]   [facility]  [phone]   [facility]  [phone]    Behavioral Health Resources in the Community: Intensive Outpatient Programs Organization         Address  Phone  Notes  [facility] [address], [place], [place] [phone]   [facility] [address], [place], [place] [phone]   [facility] [address], [place], [place]  [phone]   [facility] [address],  [place], [place] [phone] or [phone]   Substance Abuse Resources Organization         Address  Phone  Notes  [facility]  [phone]   [facility]  [phone]   [facility]  [phone]   [facility]  [phone]   Residential & Outpatient Substance Abuse Program  [phone]   Psychological Services Organization         Address  Phone  Notes  [facility]  [phone]- [phone]   [facility]  [phone]   [facility] [address], [place] [phone] or [phone]    Mobile Crisis Teams Organization         Address  Phone  Notes  [facility], Mobile Crisis Care  Unit  [phone]   [facility]  [address] [place], [place] [phone]   [facility] [address], [place]  [address] [place] [phone]    Self-Help/Support Groups Organization         Address  Phone             Notes  [facility] [place] - variety of support groups  [phone]- [phone] Call for more information  Narcotics Anonymous (NA), [facility] [address], [employer] [place]  2 meetings at this location   Statistician         Address  Phone  Notes  [facility] [address],    [place]  [phone]   [facility]  [address], [place] [address], [place], [place] [phone]   [facility] [address] [place], [place] [phone] Admissions: 8am-3pm M-F  [facility] [address]    [place], [place] [phone]   [facility] [address] [place], [place], [place] [phone]   [facility] [address]  [place], [place] [phone]   [facility] - Intensive Outpatient [address]urell Josephs [address], [place], [place] [phone]   [facility] ([facility] [address]  [place], [place] [phone] or [phone]   [facility] ([facility]) [address], [place], [place] [phone] Accepts Medicaid  [facility] [address]  [place] [phone] Substance Abuse/Addiction Treatment   [facility] Organization         Address  Phone  Notes  [facility]  [phone]   Angie Fava, PhD [address] Ervin Knack [place], [place]   [phone] or [phone]   [facility]   [address] [place], [place] [phone]   [facility] [address], [place], [place] [phone] Insurance/Medicaid/sponsorship through [facility] [address]                                     [place], [place] [phone] Therapy/tele-psych/case  [facility] [address] [place], [place] [phone]    Dr. Lolly Mustache  [phone]   [facility]  [employer] [facility] Dept. 1) [address], [place] 2) [address] [place] 3)  [address], [place] [phone]  [phone]   [facility] Child Abuse Hotline [phone] or [phone] (After Hours)

## 2014-12-11 NOTE — ED Provider Notes (Signed)
CSN: [id number]     Arrival date & time 12/11/14  1131 History   First MD Initiated Contact with Patient 12/11/14 1147     Chief Complaint  Patient presents with  . Ankle Pain    HPI   Chad Black is a 29 y.o. male with no pertinent PMH who presents to the ED with left ankle pain and swelling, which started last night after he stepped in a hole. He reports his pain has been constant since that time. He states movement exacerbates his pain. He has tried ibuprofen for symptom relief. He denies numbness, weakness, paresthesia. He reports he has broken this ankle several times in the past.   Past Medical History  Diagnosis Date  . Testicular torsion   . MRSA (methicillin resistant Staphylococcus aureus)   . GERD (gastroesophageal reflux disease)    Past Surgical History  Procedure Laterality Date  . Testicle surgery    . Hernia repair     Family History  Problem Relation Age of Onset  . Heart attack Father 36    Died   Social History  Substance Use Topics  . Smoking status: Current Every Day Smoker -- 0.50 packs/day    Types: Cigarettes  . Smokeless tobacco: Current User  . Alcohol Use: No      Review of Systems  Musculoskeletal: Positive for arthralgias.  Neurological: Negative for weakness and numbness.      Allergies  Morphine and related; Codeine; and Sulfa antibiotics  Home Medications   Prior to Admission medications   Medication Sig Start Date End Date Taking? Authorizing Provider  cephALEXin (KEFLEX) 500 MG capsule Take 1 capsule (500 mg total) by mouth 3 (three) times daily. 12/04/14  Yes Azalia Bilis, MD  ibuprofen (ADVIL,MOTRIN) 800 MG tablet Take 1 tablet (800 mg total) by mouth 3 (three) times daily. 12/11/14   Mady Gemma, PA-C  valACYclovir (VALTREX) 500 MG tablet Take 1 tablet (500 mg total) by mouth 3 (three) times daily. Patient not taking: Reported on 12/11/2014 11/19/14   Marily Memos, MD    BP 120/64 mmHg  Pulse 75  Temp(Src)  97.9 F (36.6 C) (Oral)  Resp 20  SpO2 100% Physical Exam  Constitutional: He is oriented to person, place, and time. He appears well-developed and well-nourished. No distress.  HENT:  Head: Normocephalic and atraumatic.  Right Ear: External ear normal.  Left Ear: External ear normal.  Nose: Nose normal.  Eyes: Conjunctivae and EOM are normal. Right eye exhibits no discharge. Left eye exhibits no discharge. No scleral icterus.  Neck: Normal range of motion. Neck supple.  Cardiovascular: Normal rate and regular rhythm.   Pulmonary/Chest: Effort normal and breath sounds normal. No respiratory distress.  Musculoskeletal: He exhibits edema and tenderness.  Diffuse TTP of left ankle with mild edema and decreased range of motion and decreased strength due to pain. Sensation intact. Distal pulses intact. Compartments soft.  Neurological: He is alert and oriented to person, place, and time.  Skin: Skin is warm and dry. He is not diaphoretic.  Psychiatric: He has a normal mood and affect. His behavior is normal.  Nursing note and vitals reviewed.   ED Course  Procedures (including critical care time)  Labs Review Labs Reviewed - No data to display  Imaging Review Dg Ankle Complete Left  12/11/2014  CLINICAL DATA:  Stepped in hole last night, twisted left foot and ankle. Severe lateral pain and soft tissue swelling. EXAM: LEFT ANKLE COMPLETE - 3+ VIEW COMPARISON:  None. FINDINGS: Lateral soft tissue swelling. No acute bony abnormality. Specifically, no fracture, subluxation, or dislocation. Soft tissues are intact. IMPRESSION: No bony abnormality. Electronically Signed   By: Charlett NoseKevin  Dover M.D.   On: 12/11/2014 12:25   Dg Foot Complete Left  12/11/2014  CLINICAL DATA:  Stepped in hole last night. Severe lateral foot and ankle pain. EXAM: LEFT FOOT - COMPLETE 3+ VIEW COMPARISON:  Ankle series performed today. FINDINGS: There is no evidence of fracture or dislocation. There is no evidence of  arthropathy or other focal bone abnormality. Soft tissues are unremarkable. IMPRESSION: Negative. Electronically Signed   By: Charlett NoseKevin  Dover M.D.   On: 12/11/2014 12:26     I have personally reviewed and evaluated these images as part of my medical decision-making.   EKG Interpretation None      MDM   Final diagnoses:  Left ankle pain    29 year old male presents with left ankle pain and swelling, which started last night after stepping in a hole. Denies numbness, weakness, paresthesia.  Patient is afebrile. Vital signs stable. Diffuse TTP of left ankle with mild edema and decreased range of motion and decreased strength due to pain. Sensation intact. Distal pulses intact. Compartments soft.   Will obtain imaging of left ankle and foot. Patient given ice and motrin for pain.  Imaging negative for fracture, subluxation, dislocation, soft tissue abnormality. Discussed findings with patient. Symptoms likely due to ankle sprain. ASO applied. Patient requesting narcotic pain medication, advised patient I do not feel narcotic pain medication is appropriate at this time. Will discharge with motrin and RICE therapy. Return precautions discussed. Patient to follow-up with orthopedics for persistent or worsening symptoms.  BP 120/64 mmHg  Pulse 75  Temp(Src) 97.9 F (36.6 C) (Oral)  Resp 20  SpO2 100%     Mady Gemmalizabeth C Lou Loewe, PA-C 12/11/14 1237  Bethann BerkshireJoseph Zammit, MD 12/12/14 1108

## 2014-12-11 NOTE — ED Notes (Signed)
Pt stepped in a hole last night. Left ankle swollen and bruised.

## 2014-12-14 ENCOUNTER — Encounter (HOSPITAL_COMMUNITY): Payer: Self-pay

## 2014-12-14 ENCOUNTER — Emergency Department (HOSPITAL_COMMUNITY)
Admission: EM | Admit: 2014-12-14 | Discharge: 2014-12-14 | Discharge status: Against Medical Advice | Payer: Medicaid Other | Attending: Emergency Medicine | Admitting: Emergency Medicine

## 2014-12-14 DIAGNOSIS — S99912A Unspecified injury of left ankle, initial encounter: Secondary | ICD-10-CM | POA: Insufficient documentation

## 2014-12-14 DIAGNOSIS — Y9289 Other specified places as the place of occurrence of the external cause: Secondary | ICD-10-CM | POA: Diagnosis not present

## 2014-12-14 DIAGNOSIS — Y998 Other external cause status: Secondary | ICD-10-CM | POA: Diagnosis not present

## 2014-12-14 DIAGNOSIS — X58XXXA Exposure to other specified factors, initial encounter: Secondary | ICD-10-CM | POA: Diagnosis not present

## 2014-12-14 DIAGNOSIS — Y9389 Activity, other specified: Secondary | ICD-10-CM | POA: Insufficient documentation

## 2014-12-14 DIAGNOSIS — Z4802 Encounter for removal of sutures: Secondary | ICD-10-CM | POA: Diagnosis not present

## 2014-12-14 DIAGNOSIS — F17210 Nicotine dependence, cigarettes, uncomplicated: Secondary | ICD-10-CM | POA: Diagnosis not present

## 2014-12-14 NOTE — ED Notes (Signed)
Patient here for removal of stitches. Also states he twisted his left ankle one week ago and has continued worsening pain. [place].

## 2014-12-14 NOTE — ED Notes (Signed)
Patient states he has changed his mind and no longer wants a medical assessment/screening or treatment. He states he is going to the [place].

## 2014-12-18 ENCOUNTER — Encounter (HOSPITAL_COMMUNITY): Payer: Self-pay | Admitting: Emergency Medicine

## 2014-12-18 ENCOUNTER — Emergency Department (HOSPITAL_COMMUNITY): Payer: Medicaid Other

## 2014-12-18 ENCOUNTER — Emergency Department (HOSPITAL_COMMUNITY)
Admission: EM | Admit: 2014-12-18 | Discharge: 2014-12-18 | Disposition: A | Discharge status: Home / Self Care | Payer: Medicaid Other | Attending: Emergency Medicine | Admitting: Emergency Medicine

## 2014-12-18 DIAGNOSIS — Y92007 Garden or yard of unspecified non-institutional (private) residence as the place of occurrence of the external cause: Secondary | ICD-10-CM | POA: Insufficient documentation

## 2014-12-18 DIAGNOSIS — Z8719 Personal history of other diseases of the digestive system: Secondary | ICD-10-CM | POA: Insufficient documentation

## 2014-12-18 DIAGNOSIS — F17210 Nicotine dependence, cigarettes, uncomplicated: Secondary | ICD-10-CM | POA: Insufficient documentation

## 2014-12-18 DIAGNOSIS — Y998 Other external cause status: Secondary | ICD-10-CM | POA: Insufficient documentation

## 2014-12-18 DIAGNOSIS — Z8614 Personal history of Methicillin resistant Staphylococcus aureus infection: Secondary | ICD-10-CM | POA: Insufficient documentation

## 2014-12-18 DIAGNOSIS — S9002XA Contusion of left ankle, initial encounter: Secondary | ICD-10-CM | POA: Diagnosis not present

## 2014-12-18 DIAGNOSIS — Z87438 Personal history of other diseases of male genital organs: Secondary | ICD-10-CM | POA: Diagnosis not present

## 2014-12-18 DIAGNOSIS — Y9389 Activity, other specified: Secondary | ICD-10-CM | POA: Diagnosis not present

## 2014-12-18 DIAGNOSIS — S99912A Unspecified injury of left ankle, initial encounter: Secondary | ICD-10-CM | POA: Diagnosis present

## 2014-12-18 DIAGNOSIS — W1842XA Slipping, tripping and stumbling without falling due to stepping into hole or opening, initial encounter: Secondary | ICD-10-CM | POA: Diagnosis not present

## 2014-12-18 DIAGNOSIS — Z792 Long term (current) use of antibiotics: Secondary | ICD-10-CM | POA: Diagnosis not present

## 2014-12-18 DIAGNOSIS — Z8781 Personal history of (healed) traumatic fracture: Secondary | ICD-10-CM | POA: Insufficient documentation

## 2014-12-18 DIAGNOSIS — Z4802 Encounter for removal of sutures: Secondary | ICD-10-CM | POA: Insufficient documentation

## 2014-12-18 DIAGNOSIS — Z791 Long term (current) use of non-steroidal anti-inflammatories (NSAID): Secondary | ICD-10-CM | POA: Insufficient documentation

## 2014-12-18 DIAGNOSIS — S93402A Sprain of unspecified ligament of left ankle, initial encounter: Secondary | ICD-10-CM | POA: Insufficient documentation

## 2014-12-18 MED ORDER — IBUPROFEN 800 MG PO TABS: 800.0000 mg | ORAL_TABLET | Freq: Once | ORAL | Status: DC

## 2014-12-18 MED ORDER — IBUPROFEN 800 MG PO TABS: 800.0000 mg | ORAL_TABLET | Freq: Three times a day (TID) | ORAL | Status: AC

## 2014-12-18 NOTE — ED Notes (Signed)
Pt states that about 1 week ago stepped in a hole in the yard and hurt his lt ankle-  Still has bruising and swelling -- Pt states that he has fx's this ankle about 4 times, however wanted to wait , but pain is getting worst-- Pt was seen at [facility] for this and Dx with sprain - pt is wanting a second opinion

## 2014-12-18 NOTE — Discharge Instructions (Signed)
Ankle Sprain  An ankle sprain is an injury to the strong, fibrous tissues (ligaments) that hold the bones of your ankle joint together.   CAUSES  An ankle sprain is usually caused by a fall or by twisting your ankle. Ankle sprains most commonly occur when you step on the outer edge of your foot, and your ankle turns inward. People who participate in sports are more prone to these types of injuries.   SYMPTOMS    Pain in your ankle. The pain may be present at rest or only when you are trying to stand or walk.   Swelling.   Bruising. Bruising may develop immediately or within 1 to 2 days after your injury.   Difficulty standing or walking, particularly when turning corners or changing directions.  DIAGNOSIS   Your caregiver will ask you details about your injury and perform a physical exam of your ankle to determine if you have an ankle sprain. During the physical exam, your caregiver will press on and apply pressure to specific areas of your foot and ankle. Your caregiver will try to move your ankle in certain ways. An X-ray exam may be done to be sure a bone was not broken or a ligament did not separate from one of the bones in your ankle (avulsion fracture).   TREATMENT   Certain types of braces can help stabilize your ankle. Your caregiver can make a recommendation for this. Your caregiver may recommend the use of medicine for pain. If your sprain is severe, your caregiver may refer you to a surgeon who helps to restore function to parts of your skeletal system (orthopedist) or a physical therapist.  HOME CARE INSTRUCTIONS    Apply ice to your injury for 1-2 days or as directed by your caregiver. Applying ice helps to reduce inflammation and pain.    Put ice in a plastic bag.    Place a towel between your skin and the bag.    Leave the ice on for 15-20 minutes at a time, every 2 hours while you are awake.   Only take over-the-counter or prescription medicines for pain, discomfort, or fever as directed by  your caregiver.   Elevate your injured ankle above the level of your heart as much as possible for 2-3 days.   If your caregiver recommends crutches, use them as instructed. Gradually put weight on the affected ankle. Continue to use crutches or a cane until you can walk without feeling pain in your ankle.   If you have a plaster splint, wear the splint as directed by your caregiver. Do not rest it on anything harder than a pillow for the first 24 hours. Do not put weight on it. Do not get it wet. You may take it off to take a shower or bath.   You may have been given an elastic bandage to wear around your ankle to provide support. If the elastic bandage is too tight (you have numbness or tingling in your foot or your foot becomes cold and blue), adjust the bandage to make it comfortable.   If you have an air splint, you may blow more air into it or let air out to make it more comfortable. You may take your splint off at night and before taking a shower or bath. Wiggle your toes in the splint several times per day to decrease swelling.  SEEK MEDICAL CARE IF:    You have rapidly increasing bruising or swelling.   Your toes feel   extremely cold or you lose feeling in your foot.   Your pain is not relieved with medicine.  SEEK IMMEDIATE MEDICAL CARE IF:   Your toes are numb or blue.   You have severe pain that is increasing.  MAKE SURE YOU:    Understand these instructions.   Will watch your condition.   Will get help right away if you are not doing well or get worse.     This information is not intended to replace advice given to you by your health care provider. Make sure you discuss any questions you have with your health care provider.     Document Released: 01/04/2005 Document Revised: 01/25/2014 Document Reviewed: 01/16/2011  Elsevier Interactive Patient Education 2016 [employer]

## 2014-12-18 NOTE — ED Provider Notes (Signed)
CSN: [id number]     Arrival date & time 12/18/14  1127 History   First MD Initiated Contact with Patient 12/18/14 1153     Chief Complaint  Patient presents with  . Ankle Pain  . Suture / Staple Removal     (Consider location/radiation/quality/duration/timing/severity/associated sxs/prior Treatment) Patient is a 29 y.o. male presenting with ankle pain and suture removal. The history is provided by the patient. No language interpreter was used.  Ankle Pain Location:  Ankle Time since incident:  1 week Injury: no   Ankle location:  L ankle Pain details:    Quality:  Aching   Radiates to:  Does not radiate   Severity:  Moderate   Onset quality:  Unable to specify   Timing:  Constant   Progression:  Worsening Chronicity:  New Dislocation: no   Foreign body present:  No foreign bodies Prior injury to area:  No Relieved by:  Nothing Worsened by:  Nothing tried Ineffective treatments:  None tried Associated symptoms: swelling   Suture / Staple Removal Associated symptoms include joint swelling.    Past Medical History  Diagnosis Date  . Testicular torsion   . MRSA (methicillin resistant Staphylococcus aureus)   . GERD (gastroesophageal reflux disease)    Past Surgical History  Procedure Laterality Date  . Testicle surgery    . Hernia repair     Family History  Problem Relation Age of Onset  . Heart attack Father 29    Died   Social History  Substance Use Topics  . Smoking status: Current Every Day Smoker -- 0.50 packs/day    Types: Cigarettes  . Smokeless tobacco: Current User  . Alcohol Use: No    Review of Systems  Musculoskeletal: Positive for joint swelling.  All other systems reviewed and are negative.     Allergies  Morphine and related; Codeine; and Sulfa antibiotics  Home Medications   Prior to Admission medications   Medication Sig Start Date End Date Taking? Authorizing Provider  cephALEXin (KEFLEX) 500 MG capsule Take 1 capsule (500 mg  total) by mouth 3 (three) times daily. 12/04/14   Azalia Bilis, MD  ibuprofen (ADVIL,MOTRIN) 800 MG tablet Take 1 tablet (800 mg total) by mouth 3 (three) times daily. 12/11/14   Mady Gemma, PA-C  valACYclovir (VALTREX) 500 MG tablet Take 1 tablet (500 mg total) by mouth 3 (three) times daily. Patient not taking: Reported on 12/11/2014 11/19/14   Marily Memos, MD   BP 117/100 mmHg  Pulse 88  Temp(Src) 97.7 F (36.5 C) (Oral)  Resp 20  Ht  (1.854 m)  Wt 77.111 kg  BMI 22.43 kg/m2  SpO2 100% Physical Exam  Constitutional: He is oriented to person, place, and time. He appears well-developed and well-nourished.  HENT:  Head: Normocephalic.  Neck: Normal range of motion.  Abdominal: He exhibits no distension.  Musculoskeletal: Normal range of motion. He exhibits tenderness.  Bruised swollen left ankle,  Pain with range of motion,  nv and ns intact.  Left index finger,  Sutures pushing out, reactive around sutures, no infection  Neurological: He is alert and oriented to person, place, and time.  Skin: Skin is warm.  Psychiatric: He has a normal mood and affect.  Nursing note and vitals reviewed.   ED Course  Procedures (including critical care time) Labs Review Labs Reviewed - No data to display  Imaging Review Dg Ankle Complete Left  12/18/2014  CLINICAL DATA:  Left foot and ankle pain after  injury 1 week ago. EXAM: LEFT ANKLE COMPLETE - 3+ VIEW COMPARISON:  Plain film of the left ankle dated 12/11/2014. FINDINGS: Osseous alignment remains normal. No fracture line or displaced fracture fragment identified. Ankle mortise is symmetric and talar dome appears intact. Visualized structures of the hindfoot and midfoot appear intact and well aligned. Soft tissue swelling overlying the lateral malleolus has decreased since the previous exam. IMPRESSION: No osseous fracture or dislocation. Soft tissue swelling has decreased since the previous exam. Electronically Signed   By: Bary RichardStan   Maynard M.D.   On: 12/18/2014 12:51   Dg Foot Complete Left  12/18/2014  CLINICAL DATA:  Pain after stepping in hole 1 week prior EXAM: LEFT FOOT - COMPLETE 3+ VIEW COMPARISON:  December 11, 2014 FINDINGS: Frontal, oblique, and lateral views were obtained. There is no demonstrable fracture or dislocation. Joint spaces appear intact. No erosive change. IMPRESSION: No demonstrable fracture or dislocation. No appreciable arthropathy. Electronically Signed   By: Bretta BangWilliam  Woodruff III M.D.   On: 12/18/2014 12:50   I have personally reviewed and evaluated these images and lab results as part of my medical decision-making.   EKG Interpretation None      MDM   Final diagnoses:  Sprain of left ankle, initial encounter    Sutures removed aso Crutches Schedule to see Dr. Romeo AppleHarrison for recheck.   Lonia SkinnerLeslie K FortescueSofia, PA-C 12/18/14 1325  Donnetta HutchingBrian Cook, MD 12/19/14 [phone]

## 2017-08-06 IMAGING — DX DG FOOT COMPLETE 3+V*L*
3 series · 3 of 3 positions shown · non-contrast
Comparison: December 11, 2014

CLINICAL DATA: Pain after stepping in hole 1 week prior

EXAM:
LEFT FOOT - COMPLETE 3+ VIEW

[foot ap]
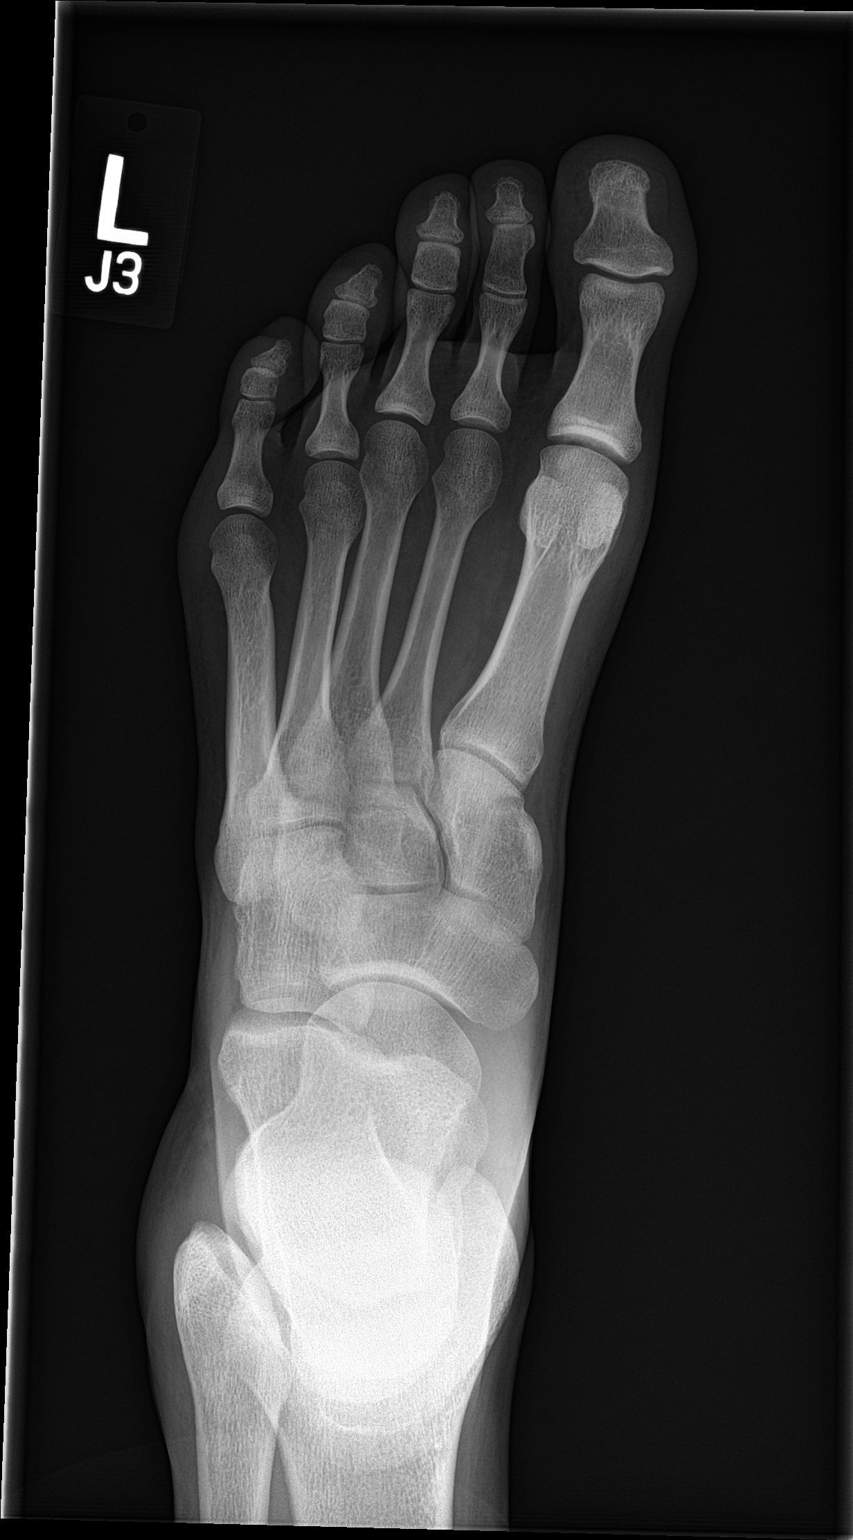

[foot obl]
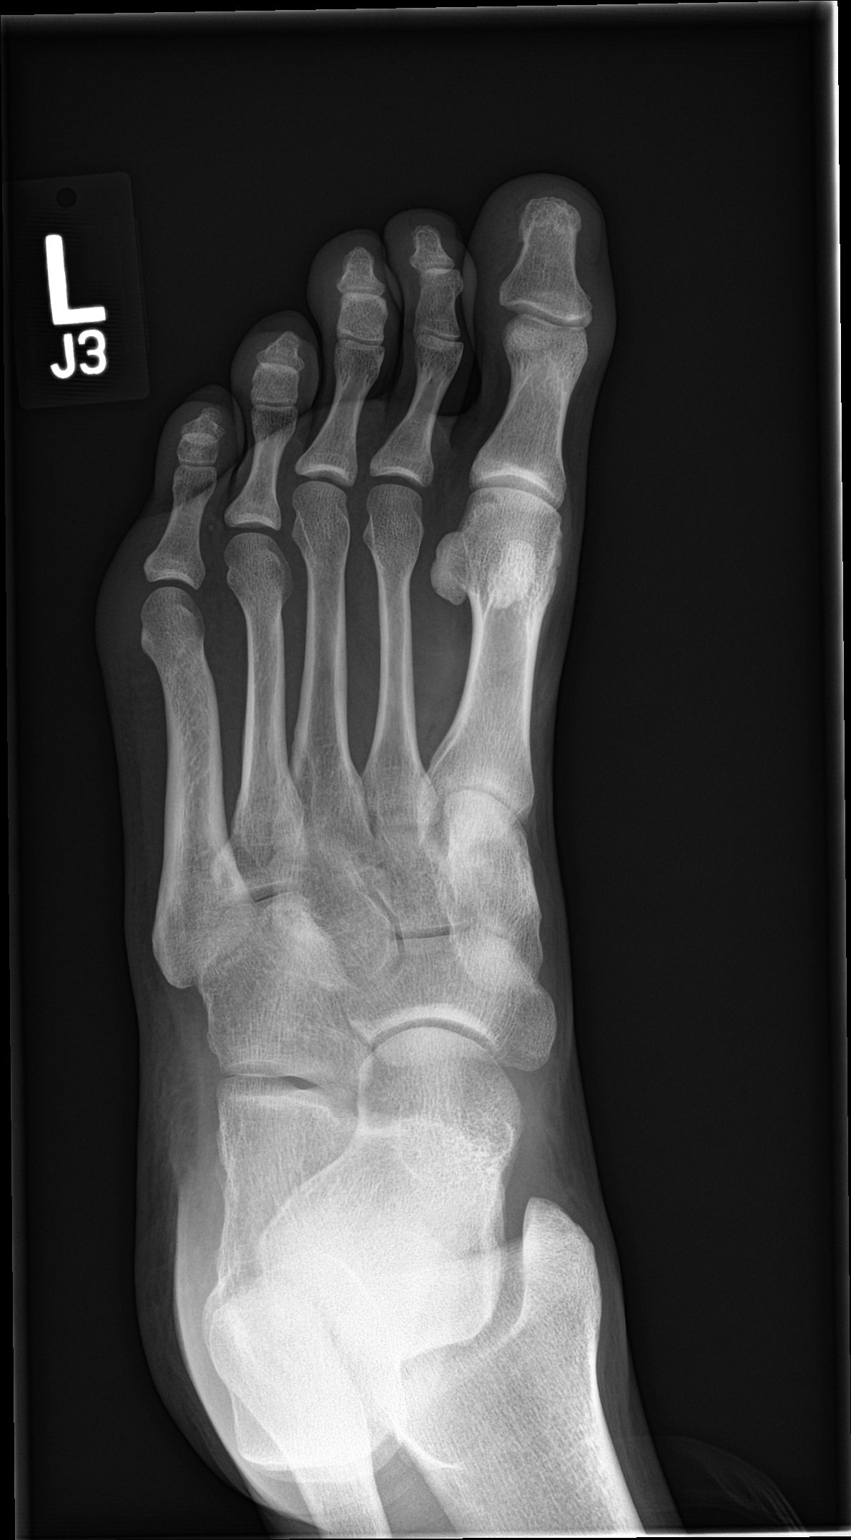

[foot lat]
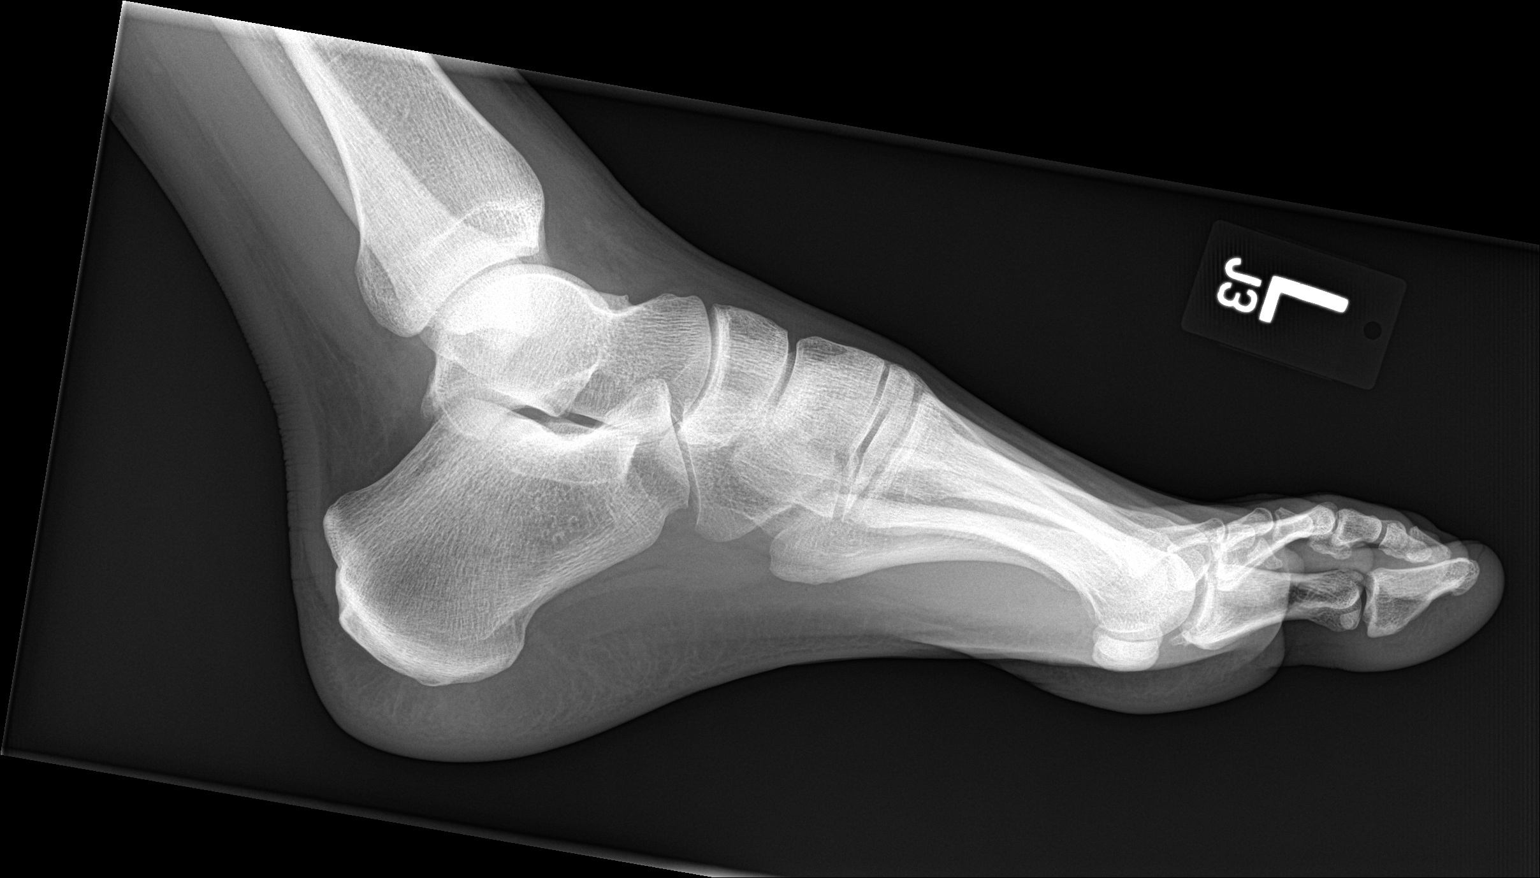

[3 of 3 positions shown; findings below may reference images not displayed]

FINDINGS: Frontal, oblique, and lateral views were obtained. There is no
demonstrable fracture or dislocation. Joint spaces appear intact. No
erosive change.
IMPRESSION: No demonstrable fracture or dislocation. No appreciable arthropathy.
